# Patient Record
Sex: Female | Born: 1990
Health system: Southern US, Community
[De-identification: ages and names within clinical notes are randomized; demographics above are authoritative.]

## PROBLEM LIST (undated history)

## (undated) ENCOUNTER — Inpatient Hospital Stay (HOSPITAL_COMMUNITY): Payer: Self-pay

## (undated) DIAGNOSIS — R768 Other specified abnormal immunological findings in serum: Principal | ICD-10-CM

## (undated) DIAGNOSIS — I10 Essential (primary) hypertension: Secondary | ICD-10-CM

## (undated) DIAGNOSIS — D591 Other autoimmune hemolytic anemias: Secondary | ICD-10-CM

## (undated) DIAGNOSIS — K589 Irritable bowel syndrome without diarrhea: Secondary | ICD-10-CM

## (undated) DIAGNOSIS — F32A Depression, unspecified: Secondary | ICD-10-CM

## (undated) DIAGNOSIS — F329 Major depressive disorder, single episode, unspecified: Secondary | ICD-10-CM

## (undated) DIAGNOSIS — K219 Gastro-esophageal reflux disease without esophagitis: Secondary | ICD-10-CM

## (undated) DIAGNOSIS — R51 Headache: Secondary | ICD-10-CM

## (undated) DIAGNOSIS — M359 Systemic involvement of connective tissue, unspecified: Secondary | ICD-10-CM

## (undated) DIAGNOSIS — Z8669 Personal history of other diseases of the nervous system and sense organs: Secondary | ICD-10-CM

## (undated) DIAGNOSIS — M329 Systemic lupus erythematosus, unspecified: Secondary | ICD-10-CM

## (undated) DIAGNOSIS — K900 Celiac disease: Secondary | ICD-10-CM

## (undated) DIAGNOSIS — F419 Anxiety disorder, unspecified: Secondary | ICD-10-CM

## (undated) DIAGNOSIS — E039 Hypothyroidism, unspecified: Secondary | ICD-10-CM

## (undated) DIAGNOSIS — D5911 Warm autoimmune hemolytic anemia: Secondary | ICD-10-CM

## (undated) DIAGNOSIS — IMO0002 Reserved for concepts with insufficient information to code with codable children: Secondary | ICD-10-CM

## (undated) DIAGNOSIS — R519 Headache, unspecified: Secondary | ICD-10-CM

## (undated) DIAGNOSIS — E559 Vitamin D deficiency, unspecified: Secondary | ICD-10-CM

## (undated) DIAGNOSIS — O24419 Gestational diabetes mellitus in pregnancy, unspecified control: Secondary | ICD-10-CM

## (undated) HISTORY — DX: Other specified abnormal immunological findings in serum: R76.8

## (undated) HISTORY — DX: Headache, unspecified: R51.9

## (undated) HISTORY — DX: Hypothyroidism, unspecified: E03.9

## (undated) HISTORY — DX: Celiac disease: K90.0

## (undated) HISTORY — DX: Headache: R51

## (undated) HISTORY — DX: Systemic involvement of connective tissue, unspecified: M35.9

## (undated) HISTORY — DX: Major depressive disorder, single episode, unspecified: F32.9

## (undated) HISTORY — DX: Vitamin D deficiency, unspecified: E55.9

## (undated) HISTORY — PX: NO PAST SURGERIES: SHX2092

## (undated) HISTORY — DX: Depression, unspecified: F32.A

## (undated) HISTORY — DX: Gestational diabetes mellitus in pregnancy, unspecified control: O24.419

## (undated) HISTORY — DX: Irritable bowel syndrome, unspecified: K58.9

## (undated) HISTORY — DX: Essential (primary) hypertension: I10

## (undated) HISTORY — DX: Gastro-esophageal reflux disease without esophagitis: K21.9

---
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
## 2010-08-08 ENCOUNTER — Encounter: Payer: Self-pay | Admitting: Family Medicine

## 2010-10-25 LAB — DIFFERENTIAL
Basophils Absolute: 0.1 10*3/uL (ref 0.0–0.1)
Lymphocytes Relative: 26 % (ref 12–46)
Lymphs Abs: 2.9 10*3/uL (ref 0.7–4.0)
Monocytes Absolute: 0.7 10*3/uL (ref 0.1–1.0)
Monocytes Relative: 6 % (ref 3–12)
Neutro Abs: 7.6 10*3/uL (ref 1.7–7.7)

## 2010-10-25 LAB — CBC
HCT: 44 % (ref 36.0–46.0)
MCV: 86.4 fL (ref 78.0–100.0)
Platelets: 391 10*3/uL (ref 150–400)
RDW: 13.6 % (ref 11.5–15.5)

## 2010-10-25 LAB — URINALYSIS, ROUTINE W REFLEX MICROSCOPIC
Bilirubin Urine: NEGATIVE
Hgb urine dipstick: NEGATIVE
Nitrite: NEGATIVE
Protein, ur: NEGATIVE mg/dL
Urobilinogen, UA: 0.2 mg/dL (ref 0.0–1.0)

## 2010-10-25 LAB — COMPREHENSIVE METABOLIC PANEL
Albumin: 4.3 g/dL (ref 3.5–5.2)
BUN: 5 mg/dL — ABNORMAL LOW (ref 6–23)
Creatinine, Ser: 0.84 mg/dL (ref 0.4–1.2)
Total Protein: 7.5 g/dL (ref 6.0–8.3)

## 2010-10-25 LAB — POCT PREGNANCY, URINE: Preg Test, Ur: NEGATIVE

## 2012-12-24 ENCOUNTER — Telehealth: Payer: Self-pay | Admitting: Family Medicine

## 2012-12-24 NOTE — Telephone Encounter (Signed)
PT C/O SHARPE PAIN IN BACK AND SOB, COUGHING. FEELS LIKE SOMETHING STUCK IN THROAT. ADVISED TO GO TO ER NOW. PT STATED HUSBAND WILL TAKE HER NOW.

## 2013-04-30 ENCOUNTER — Encounter (HOSPITAL_COMMUNITY): Payer: Self-pay | Admitting: Emergency Medicine

## 2013-04-30 DIAGNOSIS — Z792 Long term (current) use of antibiotics: Secondary | ICD-10-CM | POA: Insufficient documentation

## 2013-04-30 DIAGNOSIS — F411 Generalized anxiety disorder: Secondary | ICD-10-CM | POA: Insufficient documentation

## 2013-04-30 DIAGNOSIS — K219 Gastro-esophageal reflux disease without esophagitis: Secondary | ICD-10-CM | POA: Insufficient documentation

## 2013-04-30 DIAGNOSIS — N390 Urinary tract infection, site not specified: Secondary | ICD-10-CM | POA: Insufficient documentation

## 2013-04-30 DIAGNOSIS — IMO0001 Reserved for inherently not codable concepts without codable children: Secondary | ICD-10-CM | POA: Insufficient documentation

## 2013-04-30 DIAGNOSIS — Z3202 Encounter for pregnancy test, result negative: Secondary | ICD-10-CM | POA: Insufficient documentation

## 2013-04-30 LAB — COMPREHENSIVE METABOLIC PANEL
Alkaline Phosphatase: 72 U/L (ref 39–117)
BUN: 10 mg/dL (ref 6–23)
GFR calc Af Amer: >90 mL/min (ref >90)
Glucose, Bld: 102 mg/dL — ABNORMAL HIGH (ref 70–99)
Potassium: 4 mEq/L (ref 3.5–5.1)
Total Bilirubin: 0.3 mg/dL (ref 0.3–1.2)
Total Protein: 8.1 g/dL (ref 6.0–8.3)

## 2013-04-30 LAB — CBC WITH DIFFERENTIAL/PLATELET
Eosinophils Absolute: 0.1 10*3/uL (ref 0.0–0.7)
Hemoglobin: 13.9 g/dL (ref 12.0–15.0)
Lymphs Abs: 2.4 10*3/uL (ref 0.7–4.0)
MCH: 29.3 pg (ref 26.0–34.0)
MCV: 86.3 fL (ref 78.0–100.0)
Monocytes Relative: 7 % (ref 3–12)
Neutrophils Relative %: 67 % (ref 43–77)
RBC: 4.75 MIL/uL (ref 3.87–5.11)

## 2013-04-30 LAB — URINALYSIS, ROUTINE W REFLEX MICROSCOPIC
Ketones, ur: NEGATIVE mg/dL
Nitrite: NEGATIVE
Protein, ur: NEGATIVE mg/dL
Urobilinogen, UA: 0.2 mg/dL (ref 0.0–1.0)
pH: 7 (ref 5.0–8.0)

## 2013-04-30 NOTE — ED Notes (Signed)
The pt has  Had posterior chest pain  For one week earache neck pain headache dizziness  She has a sorethroat vomited.  Aching all over her body.  lmp sept 19

## 2013-05-01 ENCOUNTER — Emergency Department (HOSPITAL_COMMUNITY)
Admission: EM | Admit: 2013-05-01 | Discharge: 2013-05-01 | Disposition: A | Discharge status: Home / Self Care | Payer: BC Managed Care – PPO | Attending: Emergency Medicine | Admitting: Emergency Medicine

## 2013-05-01 DIAGNOSIS — N390 Urinary tract infection, site not specified: Secondary | ICD-10-CM

## 2013-05-01 DIAGNOSIS — F419 Anxiety disorder, unspecified: Secondary | ICD-10-CM

## 2013-05-01 MED ORDER — IBUPROFEN 600 MG PO TABS: 600.0000 mg | ORAL_TABLET | Freq: Four times a day (QID) | ORAL | Status: DC | PRN

## 2013-05-01 MED ORDER — NITROFURANTOIN MONOHYD MACRO 100 MG PO CAPS
100.0000 mg | ORAL_CAPSULE | Freq: Once | ORAL | Status: AC
  Administered 2013-05-01: 100 mg via ORAL
  Filled 2013-05-01: qty 1

## 2013-05-01 MED ORDER — IBUPROFEN 400 MG PO TABS
600.0000 mg | ORAL_TABLET | Freq: Once | ORAL | Status: AC
  Administered 2013-05-01: 600 mg via ORAL
  Filled 2013-05-01 (×2): qty 1

## 2013-05-01 MED ORDER — CETIRIZINE HCL 10 MG PO TABS: 10.0000 mg | ORAL_TABLET | Freq: Every day | ORAL | Status: DC

## 2013-05-01 MED ORDER — NITROFURANTOIN MONOHYD MACRO 100 MG PO CAPS: 100.0000 mg | ORAL_CAPSULE | Freq: Two times a day (BID) | ORAL | Status: DC

## 2013-05-01 MED ORDER — GI COCKTAIL ~~LOC~~
30.0000 mL | Freq: Once | ORAL | Status: AC
  Administered 2013-05-01: 30 mL via ORAL
  Filled 2013-05-01: qty 30

## 2013-05-01 NOTE — ED Notes (Signed)
Pt. C/o intermittent pain starting in back and radiating to chest. States "It feels like the left side of my lungs are full". C/o SOB, fever, N/V, and generalized body aches. Pt. Alert and oriented x4.

## 2013-05-01 NOTE — ED Provider Notes (Signed)
CSN: [id number]     Arrival date & time 04/30/13  2011 History   First MD Initiated Contact with Patient 05/01/13 0032     Chief Complaint  Patient presents with  . multiple complaints    (Consider location/radiation/quality/duration/timing/severity/associated sxs/prior Treatment) Patient is a 22 y.o. female presenting with ear pain. The history is provided by the patient. No language interpreter was used.  Otalgia Location:  Left Behind ear:  No abnormality Severity:  Moderate Onset quality:  Gradual Timing:  Constant Progression:  Unchanged Context: not direct blow and not elevation change   Relieved by:  Nothing Worsened by:  Nothing tried Ineffective treatments:  None tried Associated symptoms: no abdominal pain, no cough, no diarrhea, no neck pain, no rash, no rhinorrhea, no tinnitus and no vomiting    Also has myalgias and back pain and radiates around.  No neck stiffness no rashes on the skin.  Has GERD and allergies and is off all her meds.  No swelling in the legs.  No OCP no long car trips or plane trips.  No rashes.  Is feeling anxious and thinks she is having a lot of panic attacks.  Wants ears checked History reviewed. No pertinent past medical history. History reviewed. No pertinent past surgical history. No family history on file. History  Substance Use Topics  . Smoking status: Never Smoker   . Smokeless tobacco: Not on file  . Alcohol Use: No   OB History   Grav Para Term Preterm Abortions TAB SAB Ect Mult Living                 Review of Systems  HENT: Positive for ear pain. Negative for facial swelling, rhinorrhea, tinnitus and voice change.   Respiratory: Negative for cough, chest tightness, shortness of breath and wheezing.   Gastrointestinal: Negative for vomiting, abdominal pain and diarrhea.  Musculoskeletal: Negative for neck pain and neck stiffness.  Skin: Negative for rash.  Neurological: Negative for dizziness, facial asymmetry, weakness and  numbness.  All other systems reviewed and are negative.    Allergies  Topamax  Home Medications   Current Outpatient Rx  Name  Route  Sig  Dispense  Refill  . cephALEXin (KEFLEX) 500 MG capsule   Oral   Take 500 mg by mouth 4 (four) times daily.          BP 138/67  Pulse 92  Temp(Src) 98.8 F (37.1 C)  Resp 20  Wt 195 lb 14.4 oz (88.86 kg)  SpO2 100%  LMP 04/04/2013 Physical Exam  Constitutional: She is oriented to person, place, and time. She appears well-developed and well-nourished.  HENT:  Head: Normocephalic and atraumatic.  Right Ear: No mastoid tenderness. Tympanic membrane is not injected, not perforated, not erythematous and not retracted. No hemotympanum.  Left Ear: No mastoid tenderness. Tympanic membrane is not perforated, not erythematous and not retracted. No hemotympanum.  Mouth/Throat: Oropharynx is clear and moist. No oropharyngeal exudate.  Eyes: Conjunctivae and EOM are normal. Pupils are equal, round, and reactive to light.  Neck: Normal range of motion. Neck supple.  Cardiovascular: Normal rate and regular rhythm.   Pulmonary/Chest: Effort normal and breath sounds normal. No stridor. She has no wheezes. She has no rales. She exhibits no tenderness.  Abdominal: Soft. Bowel sounds are normal. There is no tenderness. There is no rebound and no guarding.  Musculoskeletal: Normal range of motion. She exhibits no edema.  Lymphadenopathy:    She has no cervical adenopathy.  Neurological:  She is alert and oriented to person, place, and time. She has normal reflexes.  Skin: Skin is warm and dry.  Psychiatric: Her mood appears anxious. Her speech is rapid and/or pressured.    ED Course  Procedures (including critical care time) Labs Review Labs Reviewed  COMPREHENSIVE METABOLIC PANEL - Abnormal; Notable for the following:    Glucose, Bld 102 (*)    All other components within normal limits  URINALYSIS, ROUTINE W REFLEX MICROSCOPIC - Abnormal; Notable  for the following:    APPearance HAZY (*)    Hgb urine dipstick SMALL (*)    Leukocytes, UA MODERATE (*)    All other components within normal limits  URINE MICROSCOPIC-ADD ON - Abnormal; Notable for the following:    Squamous Epithelial / LPF MANY (*)    All other components within normal limits  CBC WITH DIFFERENTIAL  PREGNANCY, URINE   Imaging Review No results found.  EKG Interpretation     Ventricular Rate:  100 PR Interval:  128 QRS Duration: 86 QT Interval:  348 QTC Calculation: 448 R Axis:   75 Text Interpretation:  Normal sinus rhythm            MDM  No diagnosis found.  Date: 05/01/2013  Rate: 100  Rhythm: normal sinus rhythm  QRS Axis: normal  Intervals: normal  ST/T Wave abnormalities: normal  Conduction Disutrbances: none  Narrative Interpretation: unremarkable   Perc negative and wells 0, not cardiac in nature.     Anxiety given exam and number of complaints will also treat for allergies and UTI.   Follow up with your PMD  Delaina Fetsch K Jair Lindblad-Rasch, MD 05/01/13 [phone]

## 2013-05-26 ENCOUNTER — Other Ambulatory Visit: Payer: Self-pay | Admitting: Adult Health

## 2013-05-29 ENCOUNTER — Emergency Department (HOSPITAL_COMMUNITY)
Admission: EM | Admit: 2013-05-29 | Discharge: 2013-05-29 | Disposition: A | Discharge status: Home / Self Care | Payer: BC Managed Care – PPO | Attending: Emergency Medicine | Admitting: Emergency Medicine

## 2013-05-29 ENCOUNTER — Encounter (HOSPITAL_COMMUNITY): Payer: Self-pay | Admitting: Emergency Medicine

## 2013-05-29 ENCOUNTER — Emergency Department (HOSPITAL_COMMUNITY): Payer: BC Managed Care – PPO

## 2013-05-29 DIAGNOSIS — Z8669 Personal history of other diseases of the nervous system and sense organs: Secondary | ICD-10-CM | POA: Insufficient documentation

## 2013-05-29 DIAGNOSIS — R1110 Vomiting, unspecified: Secondary | ICD-10-CM

## 2013-05-29 DIAGNOSIS — Z888 Allergy status to other drugs, medicaments and biological substances status: Secondary | ICD-10-CM | POA: Insufficient documentation

## 2013-05-29 DIAGNOSIS — H538 Other visual disturbances: Secondary | ICD-10-CM | POA: Insufficient documentation

## 2013-05-29 DIAGNOSIS — R630 Anorexia: Secondary | ICD-10-CM | POA: Insufficient documentation

## 2013-05-29 DIAGNOSIS — F411 Generalized anxiety disorder: Secondary | ICD-10-CM | POA: Insufficient documentation

## 2013-05-29 DIAGNOSIS — Z79899 Other long term (current) drug therapy: Secondary | ICD-10-CM | POA: Insufficient documentation

## 2013-05-29 DIAGNOSIS — R209 Unspecified disturbances of skin sensation: Secondary | ICD-10-CM | POA: Insufficient documentation

## 2013-05-29 DIAGNOSIS — R112 Nausea with vomiting, unspecified: Secondary | ICD-10-CM | POA: Insufficient documentation

## 2013-05-29 DIAGNOSIS — R51 Headache: Secondary | ICD-10-CM | POA: Insufficient documentation

## 2013-05-29 HISTORY — DX: Anxiety disorder, unspecified: F41.9

## 2013-05-29 MED ORDER — ONDANSETRON HCL 4 MG PO TABS: 4.0000 mg | ORAL_TABLET | Freq: Four times a day (QID) | ORAL | Status: DC

## 2013-05-29 MED ORDER — METOCLOPRAMIDE HCL 5 MG/ML IJ SOLN
10.0000 mg | Freq: Once | INTRAMUSCULAR | Status: AC
  Administered 2013-05-29: 10 mg via INTRAVENOUS
  Filled 2013-05-29: qty 2

## 2013-05-29 MED ORDER — SODIUM CHLORIDE 0.9 % IV BOLUS (SEPSIS)
1000.0000 mL | Freq: Once | INTRAVENOUS | Status: AC
  Administered 2013-05-29: 1000 mL via INTRAVENOUS

## 2013-05-29 MED ORDER — DIPHENHYDRAMINE HCL 50 MG/ML IJ SOLN
50.0000 mg | Freq: Once | INTRAMUSCULAR | Status: AC
  Administered 2013-05-29: 50 mg via INTRAVENOUS
  Filled 2013-05-29: qty 1

## 2013-05-29 MED ORDER — KETOROLAC TROMETHAMINE 30 MG/ML IJ SOLN
30.0000 mg | Freq: Once | INTRAMUSCULAR | Status: AC
  Administered 2013-05-29: 30 mg via INTRAVENOUS
  Filled 2013-05-29: qty 1

## 2013-05-29 NOTE — ED Provider Notes (Signed)
CSN: [id number]     Arrival date & time 05/29/13  4696 History   First MD Initiated Contact with Patient 05/29/13 [phone]     Chief Complaint  Patient presents with  . Headache  . Numbness   (Consider location/radiation/quality/duration/timing/severity/associated sxs/prior Treatment) HPI Comments: 22 yo female with anxiety hx presents with headache and pins/ needles in feet.  HA intermittent for one month, more severe than normal migraines, posterior pressure with radiation to frontal area, gradual onset. No fevers.  Pt has intermittent mouth tingling and bilateral feet tingling, she feels she is anxious at the time. Pt has felt sinus pressure recently as well. No blood clot hx, recent surgery or OCPs.   Patient is a 22 y.o. female presenting with headaches. The history is provided by the patient.  Headache Associated symptoms: nausea and vomiting   Associated symptoms: no abdominal pain, no back pain, no fever, no neck pain and no neck stiffness     Past Medical History  Diagnosis Date  . Anxiety    History reviewed. No pertinent past surgical history. History reviewed. No pertinent family history. History  Substance Use Topics  . Smoking status: Never Smoker   . Smokeless tobacco: Not on file  . Alcohol Use: No   OB History   Grav Para Term Preterm Abortions TAB SAB Ect Mult Living                 Review of Systems  Constitutional: Positive for appetite change. Negative for fever and chills.  Eyes: Positive for visual disturbance (mild blurry bilateral).  Respiratory: Negative for shortness of breath.   Cardiovascular: Negative for chest pain.  Gastrointestinal: Positive for nausea and vomiting. Negative for abdominal pain.  Genitourinary: Negative for dysuria and flank pain.  Musculoskeletal: Negative for back pain, neck pain and neck stiffness.  Skin: Negative for rash.  Neurological: Positive for headaches. Negative for light-headedness.    Allergies   Topamax  Home Medications   Current Outpatient Rx  Name  Route  Sig  Dispense  Refill  . acetaminophen (TYLENOL) 325 MG tablet   Oral   Take 650 mg by mouth every 6 (six) hours as needed for headache.         . cetirizine (WAL-ZYR) 10 MG tablet   Oral   Take 1 tablet (10 mg total) by mouth daily.   30 tablet   0   . citalopram (CELEXA) 20 MG tablet   Oral   Take 20 mg by mouth daily.         Marland Kitchen ibuprofen (ADVIL,MOTRIN) 600 MG tablet   Oral   Take 1 tablet (600 mg total) by mouth every 6 (six) hours as needed for pain.   30 tablet   0   . naproxen sodium (ANAPROX) 220 MG tablet   Oral   Take 440 mg by mouth as needed (headache).         . ondansetron (ZOFRAN) 4 MG tablet   Oral   Take 1 tablet (4 mg total) by mouth every 6 (six) hours.   12 tablet   0    BP 157/71  Pulse 66  Temp(Src) 98 F (36.7 C) (Oral)  Resp 18  Ht 5\' 4"  (1.626 m)  Wt 195 lb (88.451 kg)  BMI 33.46 kg/m2  SpO2 99%  LMP 05/08/2013 Physical Exam  Nursing note and vitals reviewed. Constitutional: She is oriented to person, place, and time. She appears well-developed and well-nourished.  HENT:  Head: Normocephalic  and atraumatic.  Eyes: Conjunctivae are normal. Right eye exhibits no discharge. Left eye exhibits no discharge.  Neck: Normal range of motion. Neck supple. No tracheal deviation present.  Cardiovascular: Normal rate and regular rhythm.   Pulmonary/Chest: Effort normal and breath sounds normal.  Abdominal: Soft. She exhibits no distension. There is no tenderness. There is no guarding.  Musculoskeletal: She exhibits no edema.  Neurological: She is alert and oriented to person, place, and time. GCS eye subscore is 4. GCS verbal subscore is 5. GCS motor subscore is 6.  5+ strength in UE and LE with f/e at major joints. Sensation to palpation intact in UE and LE. CNs 2-12 grossly intact.  EOMFI.  PERRL.   Finger nose and coordination intact bilateral.   Visual fields intact to  finger testing.   Skin: Skin is warm. No rash noted.  Psychiatric: She has a normal mood and affect.    ED Course  Procedures (including critical care time) Labs Review Labs Reviewed - No data to display Imaging Review Ct Head Wo Contrast  05/29/2013   CLINICAL DATA:  Chronic headache, not relieved with over the counter medication.  EXAM: CT HEAD WITHOUT CONTRAST  TECHNIQUE: Contiguous axial images were obtained from the base of the skull through the vertex without intravenous contrast.  COMPARISON:  None available for comparison at time of study interpretation.  FINDINGS: The ventricles and sulci are normal. No intraparenchymal hemorrhage, mass effect nor midline shift. No acute large vascular territory infarcts.  No abnormal extra-axial fluid collections. Basal cisterns are patent.  No skull fracture. Visualized paranasal sinuses and mastoid air-cells are well-aerated. The included ocular globes and orbital contents are non-suspicious.  IMPRESSION: No acute intracranial process.  Normal noncontrast CT of the brain.   Electronically Signed   By: Awilda Metro   On: 05/29/2013 06:08    EKG Interpretation   None       MDM   1. Headache   2. Vomiting    Sinus HA vs migraine vs sinusitis vs intracranial htn vs other. No signs of meningitis. Normal neuro exam, no obvious papilledema.   HA improved in ED. CT no acute findings. Discussed close fup with neuro and pcp. Pt comfortable with plan.  DC    Enid Skeens, MD 05/29/13 [phone]

## 2013-05-29 NOTE — ED Notes (Signed)
Pt states understanding of discharge instructions 

## 2013-05-29 NOTE — ED Notes (Signed)
Pt complains of headache and pins and needles in hands and feet, headache onset 1 month ago. No relief with otc meds

## 2013-06-01 ENCOUNTER — Emergency Department (HOSPITAL_COMMUNITY)
Admission: EM | Admit: 2013-06-01 | Discharge: 2013-06-01 | Disposition: A | Discharge status: Home / Self Care | Payer: BC Managed Care – PPO | Attending: Emergency Medicine | Admitting: Emergency Medicine

## 2013-06-01 ENCOUNTER — Encounter (HOSPITAL_COMMUNITY): Payer: Self-pay | Admitting: Emergency Medicine

## 2013-06-01 DIAGNOSIS — Z3202 Encounter for pregnancy test, result negative: Secondary | ICD-10-CM | POA: Insufficient documentation

## 2013-06-01 DIAGNOSIS — R630 Anorexia: Secondary | ICD-10-CM | POA: Insufficient documentation

## 2013-06-01 DIAGNOSIS — Z79899 Other long term (current) drug therapy: Secondary | ICD-10-CM | POA: Insufficient documentation

## 2013-06-01 DIAGNOSIS — E860 Dehydration: Secondary | ICD-10-CM | POA: Insufficient documentation

## 2013-06-01 DIAGNOSIS — H53149 Visual discomfort, unspecified: Secondary | ICD-10-CM | POA: Insufficient documentation

## 2013-06-01 DIAGNOSIS — Z888 Allergy status to other drugs, medicaments and biological substances status: Secondary | ICD-10-CM | POA: Insufficient documentation

## 2013-06-01 DIAGNOSIS — H538 Other visual disturbances: Secondary | ICD-10-CM | POA: Insufficient documentation

## 2013-06-01 DIAGNOSIS — F411 Generalized anxiety disorder: Secondary | ICD-10-CM | POA: Insufficient documentation

## 2013-06-01 DIAGNOSIS — R51 Headache: Secondary | ICD-10-CM | POA: Insufficient documentation

## 2013-06-01 LAB — POCT I-STAT, CHEM 8
BUN: 14 mg/dL (ref 6–23)
Calcium, Ion: 1.19 mmol/L (ref 1.12–1.23)
Chloride: 104 mEq/L (ref 96–112)
Glucose, Bld: 88 mg/dL (ref 70–99)
HCT: 45 % (ref 36.0–46.0)
Potassium: 4.3 mEq/L (ref 3.5–5.1)
Sodium: 139 mEq/L (ref 135–145)

## 2013-06-01 LAB — URINALYSIS, ROUTINE W REFLEX MICROSCOPIC
Glucose, UA: NEGATIVE mg/dL
Hgb urine dipstick: NEGATIVE
Ketones, ur: 40 mg/dL — AB
Nitrite: NEGATIVE
Specific Gravity, Urine: 1.029 (ref 1.005–1.030)
pH: 5 (ref 5.0–8.0)

## 2013-06-01 MED ORDER — METOCLOPRAMIDE HCL 5 MG/ML IJ SOLN
10.0000 mg | Freq: Once | INTRAMUSCULAR | Status: DC
  Filled 2013-06-01: qty 2

## 2013-06-01 MED ORDER — SODIUM CHLORIDE 0.9 % IV BOLUS (SEPSIS)
1000.0000 mL | Freq: Once | INTRAVENOUS | Status: DC
Start: 2013-06-01 — End: 2013-06-01

## 2013-06-01 MED ORDER — KETOROLAC TROMETHAMINE 30 MG/ML IJ SOLN
30.0000 mg | Freq: Once | INTRAMUSCULAR | Status: DC
  Filled 2013-06-01: qty 1

## 2013-06-01 MED ORDER — DIPHENHYDRAMINE HCL 50 MG/ML IJ SOLN
25.0000 mg | Freq: Once | INTRAMUSCULAR | Status: DC
  Filled 2013-06-01: qty 1

## 2013-06-01 NOTE — ED Notes (Signed)
Refused IV start; does not want medication or fluids. Notified [place], PA.

## 2013-06-01 NOTE — ED Notes (Signed)
Pt reports that she was just here for a headache that has been persistent for the past month. Reports that she has had nausea and vomiting. Pt reports she has been having hot flashes, reports that she has also had a blurred vision.

## 2013-06-01 NOTE — Discharge Instructions (Signed)
Please read and follow all provided instructions.  Your diagnoses today include:  1. Headache     Tests performed today include:  Blood counts and electrolytes - shows mild dehydration  Urine test - shows mild dehydration  Vital signs. See below for your results today.   Medications:   Use home medications  Take any prescribed medications only as directed.  Additional information:  Follow any educational materials contained in this packet.  You are having a headache. No specific cause was found today for your headache. It may have been a migraine or other cause of headache. Stress, anxiety, fatigue, and depression are common triggers for headaches. \  Your headache today does not appear to be life-threatening or require hospitalization, but often the exact cause of headaches is not determined in the emergency department. Therefore, follow-up with your doctor is very important to find out what may have caused your headache and whether or not you need any further diagnostic testing or treatment.   Sometimes headaches can appear benign (not harmful), but then more serious symptoms can develop which should prompt an immediate re-evaluation by your doctor or the emergency department.  BE VERY CAREFUL not to take multiple medicines containing Tylenol (also called acetaminophen). Doing so can lead to an overdose which can damage your liver and cause liver failure and possibly death.   Follow-up instructions: Please follow-up with your primary care provider in the next 3 days for further evaluation of your symptoms. If you do not have a primary care doctor -- see below for referral information.   Return instructions:   Please return to the Emergency Department if you experience worsening symptoms.  Return if the medications do not resolve your headache, if it recurs, or if you have multiple episodes of vomiting or cannot keep down fluids.  Return if you have a change from the usual  headache.  RETURN IMMEDIATELY IF you:  Develop a sudden, severe headache  Develop confusion or become poorly responsive or faint  Develop a fever above 100.11F or problem breathing  Have a change in speech, vision, swallowing, or understanding  Develop new weakness, numbness, tingling, incoordination in your arms or legs  Have a seizure  Please return if you have any other emergent concerns.  Additional Information:  Your vital signs today were: BP 129/67   Pulse 75   Temp(Src) 98.6 F (37 C) (Oral)   Resp 16   Wt 194 lb (87.998 kg)   SpO2 100%   LMP 05/08/2013 If your blood pressure (BP) was elevated above 135/85 this visit, please have this repeated by your doctor within one month. -------------- RESOURCE GUIDE  Chronic Pain Problems:  [facility]:  [phone]  Patients need to be referred by their primary care doctor  Insufficient Money for Medicine:  [employer]:     call "211"  [facility]:  [phone]  No Primary Care Doctor:  To locate a primary care doctor that accepts your insurance or provides certain services:  [facility] :   [phone]  Physician Referral Service:  [phone]  Agencies that provide inexpensive medical care:  [name]:   [phone]  [name]:   [phone]  [facility]:   [phone]  Women's Clinic:     [phone]  Planned Parenthood:    [phone]  [facility]:     [phone]  Medicaid-accepting [facility] Providers:  [facility]  [address], [phone], Mon-Fri 9am-7pm, Sat 9am-1pm  [facility]  [address] [place], [place], [phone]  [facility]  [address], [address] [place], [phone]  [facility]  [address] [phone]  Renaye Rakers  [address], [phone]  Only accepts [employer]  patients after they have their name  applied to their card  Self Pay (no insurance) in [facility]:  Sickle Cell Patients: Dr. Willey Blade, [facility]  [address] [place], [phone]  [facility]  [address] [place], [phone]  [name] [place]  [address] [facility] [address], [address], [facility] - [address]  [phone], Mon-Fri 9am-7pm, Sat 9am-1pm  [facility]  [address] [place], [phone]  [facility]  [address], [phone]  [facility]  [address] [phone]  Dr. Julio Sicks  [address], [place], [phone]  [facility]  [address] [phone]  [facility]  [address], [phone]  [address], [phone]  [facility]  [address] [place], [phone], 1st & 3rd Saturday every month, 10am-1pm  Strategies for finding a Primary Care Provider:  1) Find a Doctor and Pay Out of Pocket Although you won't have to find out who is covered by your insurance plan, it is a good idea to ask around and get recommendations. You will then need to call the office and see if the doctor you have chosen will accept you as a new patient and what types of options they offer for patients who are self-pay. Some doctors offer discounts or will set up payment plans for their patients who do not have insurance, but you will need to ask so you aren't surprised when you get to your appointment.  2) Contact Your Local Health Department Not all health departments have doctors that can see patients for sick visits, but many do, so it is worth a call to see if yours does. If you don't know where your local health department is, you can check in your phone book. The CDC also has a tool to help you locate your state's health department, and many state websites also have listings of all of their local  health departments.  3) Find a Walk-in Clinic If your illness is not likely to be very severe or complicated, you may want to try a walk in clinic. These are popping up all over the country in pharmacies, drugstores, and shopping centers. They're usually staffed by nurse practitioners or physician assistants that have been trained to treat common illnesses and complaints. They're usually fairly quick and inexpensive. However, if you have serious medical issues or chronic medical problems, these are probably not your best option  STD Testing:  [facility], [place] [facility]  [address], [place], phone [phone] or [phone]    Monday - Friday, call for an appointment  [facility] of [facility], [place] [facility]  [address], [place], phone [phone] or [phone]   Monday - Friday, call for an appointment  Abuse/Neglect:  [facility] Child Abuse Hotline:  365-853-2458  [facility] Child Abuse Hotline: 440-207-5750 (After Hours)  Emergency Shelter:  [facility] [phone]  Maternity Homes:  Room  at the [facility] [place] [facility]: [phone]  [facility] Alert Services: 651-812-1002  MRSA Hotline:   867-208-1416   [facility]  [address] [place]  [phone]   [place]  [address], [place]  [phone]   [facility] Dept.  [address], [place]  [phone]   [facility]  [phone]   [facility]  [phone]   [facility] in [place]  [address]  [phone], [facility] Child Abuse Hotline  857-783-3557  [phone] (After Hours)   Behavioral Health Services  Substance Abuse Resources:  Alcohol and Drug Services:     [phone]  Addiction  [facility]:   [phone]  [facility]:     [phone]  [facility]:      [phone]  Residential & Outpatient Substance Abuse Program: 3010286687  Psychological Services:  [facility]:   [phone]  [facility] Services:    [phone]  [facility] Mental Health  [address], [place]  ACCESS LINE: [phone] or [phone]  [employer]  Dental Assistance: If unable to pay or uninsured, contact:  [facility] or [facility]. to become qualified for the adult dental clinic.  Patients with Medicaid  [facility]  [phone] [address], [phone]  [address], [phone]  If unable to pay, or uninsured: contact [facility] [phone]) or [facility] Department [phone] in [place], [phone] in [facility]) to become qualified for the adult dental clinic  Other Low-Cost Community Dental Services:  [facility]  [address] [place], [place], [place], [address]  [phone], Ext. [phone]  2nd and 4th Thursday of the month at [facility]  [facility]  [address] [place], [place], [place], [address]  [phone]  [facility]  [address] [place], [place], [address]  [phone]  [facility] Health Department  [phone]  [facility] Health Department  [phone]  [facility] Department  [phone]

## 2013-06-01 NOTE — ED Provider Notes (Signed)
CSN: [id number]     Arrival date & time 06/01/13  1750 History   First MD Initiated Contact with Patient 06/01/13 1807     Chief Complaint  Patient presents with  . Headache   (Consider location/radiation/quality/duration/timing/severity/associated sxs/prior Treatment) HPI Comments: Patient presents with complaint of headache for the past one month. Headache is constantly there and has not gone away. It is generalized. Patient was seen for this in emergency department 3 days ago had a negative CT scan and was told to followup with urology and take Zofran. Patient states that she was feeling better when she emergency department and the Zofran is not making her feel any better. Patient has not had vomiting but states that she has been unable to eat or drink anything because she "doesn't feel like it". She reports mild photophobia. Patient reports that any loud noises "makes her jump". She denies fever or trouble moving her head or neck. However, she has noted she's been having hot flashes. She denies weakness in her arms or her legs. No vision loss but she has had blurry vision at times. She is scared that she has a serious medical condition. She denies head injury. She denies vague chest and abdominal pains that are intermittent.   The history is provided by the patient and medical records.    Past Medical History  Diagnosis Date  . Anxiety    History reviewed. No pertinent past surgical history. History reviewed. No pertinent family history. History  Substance Use Topics  . Smoking status: Never Smoker   . Smokeless tobacco: Not on file  . Alcohol Use: No   OB History   Grav Para Term Preterm Abortions TAB SAB Ect Mult Living                 Review of Systems  Constitutional: Positive for appetite change. Negative for fever and chills.  HENT: Negative for congestion, dental problem, rhinorrhea and sinus pressure.   Eyes: Positive for photophobia. Negative for discharge, redness and  visual disturbance.  Respiratory: Negative for shortness of breath.   Cardiovascular: Negative for chest pain.  Gastrointestinal: Negative for nausea and vomiting.  Genitourinary: Negative for frequency and flank pain.  Musculoskeletal: Negative for gait problem, neck pain and neck stiffness.  Skin: Negative for rash.  Neurological: Positive for headaches. Negative for syncope, speech difficulty, weakness, light-headedness and numbness.  Psychiatric/Behavioral: Negative for confusion.    Allergies  Celexa and Topamax  Home Medications   Current Outpatient Rx  Name  Route  Sig  Dispense  Refill  . acetaminophen (TYLENOL) 325 MG tablet   Oral   Take 650 mg by mouth every 6 (six) hours as needed for headache.         . albuterol (PROVENTIL) (2.5 MG/3ML) 0.083% nebulizer solution   Nebulization   Take 2.5 mg by nebulization every 6 (six) hours as needed for wheezing or shortness of breath.         . ferrous sulfate 325 (65 FE) MG tablet   Oral   Take 325 mg by mouth daily with breakfast.         . HYDROcodone-acetaminophen (NORCO/VICODIN) 5-325 MG per tablet   Oral   Take 0.5 tablets by mouth every 6 (six) hours as needed for moderate pain.         Marland Kitchen ondansetron (ZOFRAN) 4 MG tablet   Oral   Take 4 mg by mouth every 8 (eight) hours as needed for nausea or vomiting.  BP 142/92  Pulse 92  Temp(Src) 98.6 F (37 C) (Oral)  Resp 16  Wt 194 lb (87.998 kg)  SpO2 100%  LMP 05/08/2013 Physical Exam  Nursing note and vitals reviewed. Constitutional: She is oriented to person, place, and time. She appears well-developed and well-nourished.  HENT:  Head: Normocephalic and atraumatic.  Right Ear: Tympanic membrane, external ear and ear canal normal.  Left Ear: Tympanic membrane, external ear and ear canal normal.  Nose: Nose normal.  Mouth/Throat: Uvula is midline, oropharynx is clear and moist and mucous membranes are normal.  Eyes: Conjunctivae, EOM and lids  are normal. Pupils are equal, round, and reactive to light. Right eye exhibits no nystagmus. Left eye exhibits no nystagmus.  Neck: Normal range of motion. Neck supple.  Cardiovascular: Normal rate and regular rhythm.   Pulmonary/Chest: Effort normal and breath sounds normal.  Abdominal: Soft. There is no tenderness.  Musculoskeletal:       Cervical back: She exhibits normal range of motion, no tenderness and no bony tenderness.  Neurological: She is alert and oriented to person, place, and time. She has normal strength and normal reflexes. No cranial nerve deficit or sensory deficit. She displays a negative Romberg sign. Coordination and gait normal. GCS eye subscore is 4. GCS verbal subscore is 5. GCS motor subscore is 6.  Skin: Skin is warm and dry.  Psychiatric: Her mood appears anxious.    ED Course  Procedures (including critical care time) Labs Review Labs Reviewed  URINALYSIS, ROUTINE W REFLEX MICROSCOPIC - Abnormal; Notable for the following:    Bilirubin Urine SMALL (*)    Ketones, ur 40 (*)    All other components within normal limits  POCT I-STAT, CHEM 8 - Abnormal; Notable for the following:    Hemoglobin 15.3 (*)    All other components within normal limits  POCT PREGNANCY, URINE   Imaging Review No results found.  EKG Interpretation   None      6:44 PM Patient seen and examined. Work-up initiated. Medications ordered.   Vital signs reviewed and are as follows: Filed Vitals:   06/01/13 1818  BP: 142/92  Pulse: 92  Temp:   Resp: 16   8:33 PM After I left the room, patient refuses all treatments. She is willing to wait for lab results. Labs demonstrate only mild dehydration.  Patient informed. She is encouraged to follow up with her primary care physician for further evaluation.  Patient counseled to return if they have weakness in their arms or legs, slurred speech, trouble walking or talking, confusion, trouble with their balance, or if they have any other  concerns. Patient verbalizes understanding and agrees with plan.     MDM   1. Headache    Patient with headache x1 month. Previous head CT was negative. Patient with no new neurological features tonight. She has a normal neurological exam. Labs are unremarkable. Patient does not appear to be significantly dehydrated. She will benefit from PCP and/or neurology followup for evaluation of these headaches. Do not suspect intracranial bleeding, meningitis given recent workup and exam tonight. Doubt pseudotumor cerebri given clinical presentation, no difficulty with balance or vision. Patient appears well but anxious. She's given appropriate return instructions and she is encouraged to followup as soon as possible.    Renne Crigler, PA-C 06/01/13 2037

## 2013-06-02 NOTE — ED Provider Notes (Signed)
Medical screening examination/treatment/procedure(s) were performed by non-physician practitioner and as supervising physician I was immediately available for consultation/collaboration.  EKG Interpretation   None         Gwyneth Sprout, MD 06/02/13 1354

## 2013-07-08 ENCOUNTER — Encounter: Payer: Self-pay | Admitting: Family Medicine

## 2013-07-08 ENCOUNTER — Ambulatory Visit (INDEPENDENT_AMBULATORY_CARE_PROVIDER_SITE_OTHER): Payer: BC Managed Care – PPO | Admitting: Family Medicine

## 2013-07-08 VITALS — BP 132/86 | HR 95 | Temp 98.4°F | Ht 64.0 in | Wt 200.6 lb

## 2013-07-08 DIAGNOSIS — D649 Anemia, unspecified: Secondary | ICD-10-CM

## 2013-07-08 DIAGNOSIS — Z13220 Encounter for screening for lipoid disorders: Secondary | ICD-10-CM

## 2013-07-08 DIAGNOSIS — M25559 Pain in unspecified hip: Secondary | ICD-10-CM

## 2013-07-08 DIAGNOSIS — Z23 Encounter for immunization: Secondary | ICD-10-CM

## 2013-07-08 DIAGNOSIS — J309 Allergic rhinitis, unspecified: Secondary | ICD-10-CM

## 2013-07-08 MED ORDER — FLUTICASONE PROPIONATE 50 MCG/ACT NA SUSP: 2.0000 | Freq: Every day | NASAL | Status: DC

## 2013-07-08 MED ORDER — MIRTAZAPINE 7.5 MG PO TABS: 7.5000 mg | ORAL_TABLET | Freq: Every day | ORAL | Status: DC
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
## 2013-07-08 NOTE — Patient Instructions (Signed)
      Dr Woodroe Mode Recommendations  For nutrition information, I recommend books:  1).Eat to Live by Dr Monico Hoar. 2).Prevent and Reverse Heart Disease by Dr Suzzette Righter. 3) Dr Katherina Right Book:  Program to Reverse Diabetes  Exercise recommendations are:  If unable to walk, then the patient can exercise in a chair 3 times a day. By flapping arms like a bird gently and raising legs outwards to the front.  If ambulatory, the patient can go for walks for 30 minutes 3 times a week. Then increase the intensity and duration as tolerated.  Goal is to try to attain exercise frequency to 5 times a week.  If applicable: Best to perform resistance exercises (machines or weights) 2 days a week and cardio type exercises 3 days per week.    Book: What Happy People Know.

## 2013-07-09 LAB — CMP14+EGFR
ALT: 18 IU/L (ref 0–32)
AST: 14 IU/L (ref 0–40)
Albumin/Globulin Ratio: 1.9 (ref 1.1–2.5)
Albumin: 4.8 g/dL (ref 3.5–5.5)
Alkaline Phosphatase: 67 IU/L (ref 39–117)
BUN/Creatinine Ratio: 14 (ref 8–20)
BUN: 11 mg/dL (ref 6–20)
CO2: 21 mmol/L (ref 18–29)
Calcium: 9.4 mg/dL (ref 8.7–10.2)
Chloride: 99 mmol/L (ref 97–108)
Creatinine, Ser: 0.77 mg/dL (ref 0.57–1.00)
GFR calc Af Amer: 126 mL/min/{1.73_m2} (ref >59)
GFR calc non Af Amer: 109 mL/min/{1.73_m2} (ref >59)
Globulin, Total: 2.5 g/dL (ref 1.5–4.5)
Glucose: 85 mg/dL (ref 65–99)
Potassium: 4.7 mmol/L (ref 3.5–5.2)
Sodium: 138 mmol/L (ref 134–144)
Total Bilirubin: 0.3 mg/dL (ref 0.0–1.2)
Total Protein: 7.3 g/dL (ref 6.0–8.5)

## 2013-07-09 LAB — ANEMIA PROFILE B
Basophils Absolute: 0 10*3/uL (ref 0.0–0.2)
Basos: 0 %
Eos: 2 %
Eosinophils Absolute: 0.2 10*3/uL (ref 0.0–0.4)
Ferritin: 128 ng/mL (ref 15–150)
Folate: 16.1 ng/mL (ref >3.0)
HCT: 40.5 % (ref 34.0–46.6)
Hemoglobin: 13.6 g/dL (ref 11.1–15.9)
Immature Grans (Abs): 0 10*3/uL (ref 0.0–0.1)
Immature Granulocytes: 0 %
Iron Saturation: 14 % — ABNORMAL LOW (ref 15–55)
Iron: 52 ug/dL (ref 35–155)
Lymphocytes Absolute: 2.1 10*3/uL (ref 0.7–3.1)
Lymphs: 24 %
MCH: 29.2 pg (ref 26.6–33.0)
MCHC: 33.6 g/dL (ref 31.5–35.7)
MCV: 87 fL (ref 79–97)
Monocytes Absolute: 0.5 10*3/uL (ref 0.1–0.9)
Monocytes: 6 %
Neutrophils Absolute: 5.9 10*3/uL (ref 1.4–7.0)
Neutrophils Relative %: 68 %
Platelets: 371 10*3/uL (ref 150–379)
RBC: 4.66 x10E6/uL (ref 3.77–5.28)
RDW: 13.6 % (ref 12.3–15.4)
Retic Ct Pct: 1.7 % (ref 0.6–2.6)
TIBC: 364 ug/dL (ref 250–450)
UIBC: 312 ug/dL (ref 150–375)
Vitamin B-12: 826 pg/mL (ref 211–946)
WBC: 8.7 10*3/uL (ref 3.4–10.8)

## 2013-07-09 LAB — VITAMIN D 25 HYDROXY (VIT D DEFICIENCY, FRACTURES): Vit D, 25-Hydroxy: 24.7 ng/mL — ABNORMAL LOW (ref 30.0–100.0)

## 2013-07-09 LAB — LIPID PANEL
Chol/HDL Ratio: 4.5 ratio units — ABNORMAL HIGH (ref 0.0–4.4)
Cholesterol, Total: 170 mg/dL (ref 100–189)
HDL: 38 mg/dL — ABNORMAL LOW (ref >39)
LDL Calculated: 105 mg/dL (ref 0–119)
Triglycerides: 136 mg/dL — ABNORMAL HIGH (ref 0–114)
VLDL Cholesterol Cal: 27 mg/dL (ref 5–40)

## 2013-07-09 LAB — LYME AB/WESTERN BLOT REFLEX
LYME DISEASE AB, QUANT, IGM: <0.8 index (ref 0.00–0.79)
Lyme IgG/IgM Ab: <0.91 {ISR} (ref 0.00–0.90)

## 2013-07-09 LAB — THYROID PANEL WITH TSH
Free Thyroxine Index: 2.2 (ref 1.2–4.9)
T3 Uptake Ratio: 32 % (ref 24–39)
T4, Total: 6.9 ug/dL (ref 4.5–12.0)
TSH: 6.1 u[IU]/mL — ABNORMAL HIGH (ref 0.450–4.500)

## 2013-07-09 LAB — CK: Total CK: 60 U/L (ref 24–173)

## 2013-07-09 LAB — SEDIMENTATION RATE: Sed Rate: 8 mm/hr (ref 0–32)

## 2013-07-09 LAB — ALDOLASE: Aldolase: 5.2 U/L (ref 3.3–10.3)

## 2013-07-14 ENCOUNTER — Telehealth: Payer: Self-pay | Admitting: Family Medicine

## 2013-07-14 ENCOUNTER — Other Ambulatory Visit: Payer: Self-pay | Admitting: Family Medicine

## 2013-07-14 DIAGNOSIS — E039 Hypothyroidism, unspecified: Secondary | ICD-10-CM

## 2013-07-14 DIAGNOSIS — E559 Vitamin D deficiency, unspecified: Secondary | ICD-10-CM | POA: Insufficient documentation

## 2013-07-14 MED ORDER — LEVOTHYROXINE SODIUM 25 MCG PO TABS: 25.0000 ug | ORAL_TABLET | Freq: Every day | ORAL | Status: DC

## 2013-07-14 NOTE — Telephone Encounter (Signed)
PT CALLED 07/14/13 4:40 AWARE THAT PROVIDER HAS TO REVIEW THE LABS AND NURSE WILL CALL BACK 07/15/13

## 2013-07-14 NOTE — Telephone Encounter (Signed)
Left message on pt vm --labs are here but not reviewed and will call tomorrow

## 2013-07-14 NOTE — Progress Notes (Signed)
Quick Note:  Call Patient Labs that are abnormal: Mild hypothyroidism Mild Vit D deficiency.  These both can give muscle aches  The rest are at goal  Recommendations: Start on Vitamin D 1000 units daily OTC Start on low dose levothyroxine. Ordered in EPIC  Will need to recheck levels in 6 weeks. Should have a follow up appointment in a couple of weeks already.  ______

## 2013-07-15 ENCOUNTER — Telehealth: Payer: Self-pay

## 2013-07-15 NOTE — Telephone Encounter (Signed)
Pt notiifed of anemia panel/profile

## 2013-07-21 ENCOUNTER — Encounter: Payer: Self-pay | Admitting: Neurology

## 2013-07-21 ENCOUNTER — Ambulatory Visit (INDEPENDENT_AMBULATORY_CARE_PROVIDER_SITE_OTHER): Payer: BC Managed Care – PPO | Admitting: Neurology

## 2013-07-21 VITALS — BP 126/70 | HR 88 | Temp 98.1°F | Ht 64.0 in | Wt 207.0 lb

## 2013-07-21 DIAGNOSIS — R51 Headache: Secondary | ICD-10-CM

## 2013-07-21 DIAGNOSIS — M549 Dorsalgia, unspecified: Secondary | ICD-10-CM

## 2013-07-21 DIAGNOSIS — M542 Cervicalgia: Secondary | ICD-10-CM

## 2013-07-21 DIAGNOSIS — G894 Chronic pain syndrome: Secondary | ICD-10-CM

## 2013-07-21 DIAGNOSIS — R519 Headache, unspecified: Secondary | ICD-10-CM

## 2013-07-21 LAB — C-REACTIVE PROTEIN: CRP: 0.8 mg/dL (ref 0.5–20.0)

## 2013-07-21 MED ORDER — AMITRIPTYLINE HCL 10 MG PO TABS: 10.0000 mg | ORAL_TABLET | Freq: Every day | ORAL | Status: DC

## 2013-07-21 MED ORDER — PREDNISONE 10 MG PO TABS: ORAL_TABLET | ORAL | Status: DC

## 2013-07-21 NOTE — Progress Notes (Signed)
NEUROLOGY CONSULTATION NOTE  [name] MRN: [id number] DOB: Jul 03, 1991  Referring provider: Dr. Jacelyn Grip Primary care provider: Dr. Jacelyn Grip  Reason for consult:  Headache, diffuse pain  HISTORY OF PRESENT ILLNESS: [name] [name] is a 23 year old right-handed woman with history of anxiety, hypothyroidism and chronic pain who presents for headache and diffuse pain.  Records and images were personally reviewed where available.    Onset:  Since 23 years old, worse over the past year. Location:  Travels around head, except constant on back of head Quality:  Traveling stinging pain around head, constant nonthrobbing pressure pain in back of head Intensity:  4-10/10 Aura:  no Associated symptoms:  Photophobia, phonophobia. Duration:  constant Frequency:  constant Triggers/exacerbating factors:  Loud high-pitch noises, lights Relieving factors: laying down, bath   Past preventative therapy:  Topamax (caused a seizure)  Current abortive therapy: Tylenol, ibuprofen (not really effective and does not take often).  She has taken her mom's hydrocodone and tramadol, which help Current preventative therapy:  none  Sleep hygiene:  Can't sleep due to pain Stress/depression:  Panic attacks (onset since pain) Family history of headache:  Mom has history of migraine and fibromyalgia  Around the same time, she developed diffuse body pain.  She does report neck pain that can shoot down the spine and sometimes down the left posterior thigh to above the knee.  Sometimes, she notes a pressure across her upper back, like she is wearing a TENS unit, as well as tingling.  She also notes pain in the chest and sternum.  She has significant pain in her joints and throughout her body.  She denies warm joints, fevers or rash.  No associated numbness.  PAST MEDICAL HISTORY: Past Medical History  Diagnosis Date  . Anxiety   . Hypothyroidism     PAST SURGICAL HISTORY: No past surgical history on  file.  MEDICATIONS: Current Outpatient Prescriptions on File Prior to Visit  Medication Sig Dispense Refill  . acetaminophen (TYLENOL) 325 MG tablet Take 650 mg by mouth every 6 (six) hours as needed for headache.      . albuterol (PROVENTIL) (2.5 MG/3ML) 0.083% nebulizer solution Take 2.5 mg by nebulization every 6 (six) hours as needed for wheezing or shortness of breath.      . ferrous sulfate 325 (65 FE) MG tablet Take 325 mg by mouth daily with breakfast.      . fluticasone (FLONASE) 50 MCG/ACT nasal spray Place 2 sprays into both nostrils daily.  16 g  6  . levothyroxine (SYNTHROID, LEVOTHROID) 25 MCG tablet Take 1 tablet (25 mcg total) by mouth daily before breakfast.  30 tablet  5  . mirtazapine (REMERON) 7.5 MG tablet Take 1 tablet (7.5 mg total) by mouth at bedtime.  30 tablet  2   No current facility-administered medications on file prior to visit.    ALLERGIES: Allergies  Allergen Reactions  . Celexa [Citalopram Hydrobromide] Diarrhea  . Topamax [Topiramate] Nausea And Vomiting    Unable to speak    FAMILY HISTORY: No family history on file.  SOCIAL HISTORY: History   Social History  . Marital Status: Married    Spouse Name: N/A    Number of Children: N/A  . Years of Education: N/A   Occupational History  . Not on file.   Social History Main Topics  . Smoking status: Never Smoker   . Smokeless tobacco: Never Used  . Alcohol Use: No     Comment: occ  .  Drug Use: No  . Sexual Activity: Not on file   Other Topics Concern  . Not on file   Social History Narrative  . No narrative on file    REVIEW OF SYSTEMS: Constitutional: No fevers, chills, or sweats, no generalized fatigue, change in appetite Eyes: No visual changes, double vision, eye pain Ear, nose and throat: No hearing loss, ear pain, nasal congestion, sore throat Cardiovascular: No chest pain, palpitations Respiratory:  No shortness of breath at rest or with exertion,  wheezes GastrointestinaI: No nausea, vomiting, diarrhea, abdominal pain, fecal incontinence Genitourinary:  No dysuria, urinary retention or frequency Musculoskeletal:  No neck pain, back pain Integumentary: No rash, pruritus, skin lesions Neurological: as above Psychiatric: No depression, insomnia, anxiety Endocrine: No palpitations, fatigue, diaphoresis, mood swings, change in appetite, change in weight, increased thirst Hematologic/Lymphatic:  No anemia, purpura, petechiae. Allergic/Immunologic: no itchy/runny eyes, nasal congestion, recent allergic reactions, rashes  PHYSICAL EXAM: Filed Vitals:   07/21/13 1445  BP: 126/70  Pulse: 88  Temp: 98.1 F (36.7 C)   General: No acute distress Head:  Normocephalic/atraumatic Neck: supple, bilateral paraspinal tenderness, full range of motion Back: Bilateral paraspinal tenderness.  Exhibits tenderness to palpation along the sternum, scapula, hips, knees Heart: regular rate and rhythm Lungs: Clear to auscultation bilaterally. Vascular: No carotid bruits. Neurological Exam: Mental status: alert and oriented to person, place, and time, speech fluent and not dysarthric, language intact. Cranial nerves: CN I: not tested CN II: pupils equal, round and reactive to light, visual fields intact, fundi unremarkable. CN III, IV, VI:  full range of motion, no nystagmus, no ptosis CN V: facial sensation intact CN VII: upper and lower face symmetric CN VIII: hearing intact CN IX, X: gag intact, uvula midline CN XI: sternocleidomastoid and trapezius muscles intact CN XII: tongue midline Bulk & Tone: normal, no fasciculations. Motor: 5/5 throughout Sensation: pinprick and vibration intact Deep Tendon Reflexes: 2+ throughout, toes down Finger to nose testing: normal Heel to shin: normal Gait: normal stance and stride. Able to walk in tandem. Romberg negative.  IMPRESSION: 1.  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  Chronic pain syndrome.   PLAN: 1.  Will start amitriptyline 69m at bedtime. 2.  Refer to PT for neck and spine 3.  Check inflammatory markers such as ANA, ESR, CRP and RF.  Consider rheumatology referral for diffuse pain. 4.  Prednisone taper to break cycle of headache.  Thank you for allowing me to take part in the care of this patient.  AMetta Clines DO  CC:  FUlanda Edison MD

## 2013-07-21 NOTE — Patient Instructions (Addendum)
It sounds like your headaches are migraine and related to the neck.   1.  We will start amitriptyline 10mg  at bedtime to help reduce frequency of headaches. Side effects include sleepiness, dizziness and dry mouth. 2.  We will start a prednisone taper.  Take 6 pills on day one, then 5 pills on day two, then 4 pills on day three, then 3 pills on day four, then 2 pills on day five, then 1 pill on day 6, then stop.  Do not take NSAIDs such as ibuprofen or naproxen while taking the prednisone. 3.  We will refer you to physical therapy for neck and back. 4.  We will check blood work looking for a cause of generalized pain. 5.  Follow up in 3 months.  Call in 4 weeks with update.   We will [place]. They will call you to schedule an appointment.    [phone]

## 2013-07-22 LAB — RHEUMATOID FACTOR: Rhuematoid fact SerPl-aCnc: <10 IU/mL (ref <=14)

## 2013-07-22 LAB — SEDIMENTATION RATE: Sed Rate: 17 mm/hr (ref 0–22)

## 2013-07-23 LAB — ANTI-NUCLEAR AB-TITER (ANA TITER): ANA Titer 1: 1:80 {titer} — ABNORMAL HIGH

## 2013-07-23 LAB — ANA: Anti Nuclear Antibody(ANA): POSITIVE — AB

## 2013-07-24 ENCOUNTER — Ambulatory Visit: Payer: BC Managed Care – PPO | Attending: Neurology | Admitting: Physical Therapy

## 2013-07-24 ENCOUNTER — Encounter: Payer: Self-pay | Admitting: Neurology

## 2013-07-24 DIAGNOSIS — M545 Low back pain, unspecified: Secondary | ICD-10-CM | POA: Insufficient documentation

## 2013-07-24 DIAGNOSIS — IMO0001 Reserved for inherently not codable concepts without codable children: Secondary | ICD-10-CM | POA: Insufficient documentation

## 2013-07-24 DIAGNOSIS — E039 Hypothyroidism, unspecified: Secondary | ICD-10-CM | POA: Insufficient documentation

## 2013-07-24 DIAGNOSIS — M542 Cervicalgia: Secondary | ICD-10-CM | POA: Insufficient documentation

## 2013-07-24 DIAGNOSIS — M546 Pain in thoracic spine: Secondary | ICD-10-CM | POA: Insufficient documentation

## 2013-07-24 DIAGNOSIS — R5381 Other malaise: Secondary | ICD-10-CM | POA: Insufficient documentation

## 2013-07-25 MED ORDER — CYCLOBENZAPRINE HCL 5 MG PO TABS: 5.0000 mg | ORAL_TABLET | Freq: Every evening | ORAL | Status: DC | PRN

## 2013-07-25 NOTE — Telephone Encounter (Signed)
RX sent to patient's pharmacy. Replied to patient via mychart making her aware that the RX has been called in and to let us know how she is doing in two weeks per Dr Everlena CooperJaffe.

## 2013-07-29 ENCOUNTER — Ambulatory Visit: Payer: BC Managed Care – PPO | Admitting: Physical Therapy

## 2013-07-31 ENCOUNTER — Ambulatory Visit: Payer: BC Managed Care – PPO | Admitting: Physical Therapy

## 2013-08-01 ENCOUNTER — Telehealth: Payer: Self-pay | Admitting: Neurology

## 2013-08-01 NOTE — Telephone Encounter (Signed)
Message copied by Benay SpiceSTONEMAN, Rishard Delange H on Fri Aug 01, 2013  4:27 PM ------      Message from: JAFFE, ADAM R      Created: Fri Aug 01, 2013 12:19 PM       Blood work looks okay.  There is a positive ANA, which is a non-specific inflammatory marker.  However, it is only weakly positive, which is typically not clinically relevant.  Also, other inflammatory markers are normal.      ----- Message -----         From: [facility] Interface         Sent: 07/21/2013   6:11 PM           To: Cira ServantAdam Robert Jaffe, DO                   ------

## 2013-08-01 NOTE — Telephone Encounter (Signed)
Left the patient a message as per Dr. Everlena CooperJaffe below.

## 2013-08-04 ENCOUNTER — Encounter: Payer: BC Managed Care – PPO | Admitting: Physical Therapy

## 2013-08-06 ENCOUNTER — Ambulatory Visit: Payer: BC Managed Care – PPO | Admitting: Physical Therapy

## 2013-08-07 ENCOUNTER — Ambulatory Visit: Payer: BC Managed Care – PPO | Admitting: Family Medicine

## 2013-08-08 ENCOUNTER — Encounter: Payer: BC Managed Care – PPO | Admitting: Physical Therapy

## 2013-08-12 ENCOUNTER — Ambulatory Visit: Payer: BC Managed Care – PPO | Admitting: Physical Therapy

## 2013-08-14 ENCOUNTER — Ambulatory Visit: Payer: BC Managed Care – PPO | Admitting: Physical Therapy

## 2013-08-15 ENCOUNTER — Ambulatory Visit (INDEPENDENT_AMBULATORY_CARE_PROVIDER_SITE_OTHER): Payer: BC Managed Care – PPO | Admitting: Family Medicine

## 2013-08-15 ENCOUNTER — Encounter: Payer: Self-pay | Admitting: Family Medicine

## 2013-08-15 VITALS — BP 138/92 | HR 103 | Temp 100.6°F | Ht 64.0 in | Wt 214.8 lb

## 2013-08-15 DIAGNOSIS — E039 Hypothyroidism, unspecified: Secondary | ICD-10-CM

## 2013-08-15 DIAGNOSIS — F339 Major depressive disorder, recurrent, unspecified: Secondary | ICD-10-CM | POA: Insufficient documentation

## 2013-08-15 DIAGNOSIS — F411 Generalized anxiety disorder: Secondary | ICD-10-CM

## 2013-08-15 DIAGNOSIS — F32A Depression, unspecified: Secondary | ICD-10-CM | POA: Insufficient documentation

## 2013-08-15 DIAGNOSIS — F329 Major depressive disorder, single episode, unspecified: Secondary | ICD-10-CM

## 2013-08-15 DIAGNOSIS — R635 Abnormal weight gain: Secondary | ICD-10-CM | POA: Insufficient documentation

## 2013-08-15 DIAGNOSIS — F3289 Other specified depressive episodes: Secondary | ICD-10-CM

## 2013-08-15 DIAGNOSIS — F419 Anxiety disorder, unspecified: Secondary | ICD-10-CM

## 2013-08-15 DIAGNOSIS — E559 Vitamin D deficiency, unspecified: Secondary | ICD-10-CM | POA: Insufficient documentation

## 2013-08-15 NOTE — Patient Instructions (Signed)
      Dr Tristyn Pharris's Recommendations  For nutrition information, I recommend books:  1).Eat to Live by Dr Joel Fuhrman. 2).Prevent and Reverse Heart Disease by Dr Caldwell Esselstyn. 3) Dr Neal Barnard's Book:  Program to Reverse Diabetes  Exercise recommendations are:  If unable to walk, then the patient can exercise in a chair 3 times a day. By flapping arms like a bird gently and raising legs outwards to the front.  If ambulatory, the patient can go for walks for 30 minutes 3 times a week. Then increase the intensity and duration as tolerated.  Goal is to try to attain exercise frequency to 5 times a week.  If applicable: Best to perform resistance exercises (machines or weights) 2 days a week and cardio type exercises 3 days per week.  

## 2013-08-15 NOTE — Progress Notes (Signed)
Patient ID: [name] [name], female   DOB: 16-Apr-1991, 23 y.o.   MRN: [id number] SUBJECTIVE: CC: Chief Complaint  Patient presents with  . Follow-up    4 week follow up statate LMP was dec 24 and did home pregnancy test which she says were negative.     HPI: bReakfast : granola and milk, fruit Lunch: salad: grilled chicken with lemon garlic  Dressing Dinner: chicken, carrot mushroom broccoli  Snacks pretzel snacks.   Seen by Neurology. Has a low titer positive ANA probably of no significance.  Feels bad all the time. Denies suicidal ideation. Sees a therapist for counselling every 2 weeks. No hallucinations. Came for follow up. Gaining weight. Claims she eats healthy( but reviewing her diet it is high in sugars/ carbs.) Taking the vitamin D and the low dose levothyroxine.  Past Medical History  Diagnosis Date  . Hypothyroidism   . Anxiety   . Vitamin D deficiency   . Depression    No past surgical history on file. History   Social History  . Marital Status: Married    Spouse Name: N/A    Number of Children: N/A  . Years of Education: N/A   Occupational History  . Not on file.   Social History Main Topics  . Smoking status: Never Smoker   . Smokeless tobacco: Never Used  . Alcohol Use: No     Comment: occ  . Drug Use: No  . Sexual Activity: Not on file   Other Topics Concern  . Not on file   Social History Narrative  . No narrative on file   Family History  Problem Relation Age of Onset  . Fibromyalgia Mother    Current Outpatient Prescriptions on File Prior to Visit  Medication Sig Dispense Refill  . amitriptyline (ELAVIL) 10 MG tablet Take 1 tablet (10 mg total) by mouth at bedtime.  30 tablet  3  . cholecalciferol (VITAMIN D) 1000 UNITS tablet Take 1,000 Units by mouth daily.      . cyclobenzaprine (FLEXERIL) 5 MG tablet Take 1 tablet (5 mg total) by mouth at bedtime as needed for muscle spasms.  30 tablet  0  . ferrous sulfate 325 (65 FE) MG tablet  Take 325 mg by mouth daily with breakfast.      . fluticasone (FLONASE) 50 MCG/ACT nasal spray Place 2 sprays into both nostrils daily.  16 g  6  . levothyroxine (SYNTHROID, LEVOTHROID) 25 MCG tablet Take 1 tablet (25 mcg total) by mouth daily before breakfast.  30 tablet  5  . mirtazapine (REMERON) 7.5 MG tablet Take 1 tablet (7.5 mg total) by mouth at bedtime.  30 tablet  2  . vitamin B-12 (CYANOCOBALAMIN) 1000 MCG tablet Take 1,000 mcg by mouth daily.      Marland Kitchen acetaminophen (TYLENOL) 325 MG tablet Take 650 mg by mouth every 6 (six) hours as needed for headache.      . albuterol (PROVENTIL) (2.5 MG/3ML) 0.083% nebulizer solution Take 2.5 mg by nebulization every 6 (six) hours as needed for wheezing or shortness of breath.       No current facility-administered medications on file prior to visit.   Allergies  Allergen Reactions  . Celexa [Citalopram Hydrobromide] Diarrhea  . Topamax [Topiramate] Nausea And Vomiting    Unable to speak   Immunization History  Administered Date(s) Administered  . Tdap 07/08/2013   Prior to Admission medications   Medication Sig Start Date End Date Taking? Authorizing Provider  acetaminophen (TYLENOL) 325  MG tablet Take 650 mg by mouth every 6 (six) hours as needed for headache.    Historical Provider, MD  albuterol (PROVENTIL) (2.5 MG/3ML) 0.083% nebulizer solution Take 2.5 mg by nebulization every 6 (six) hours as needed for wheezing or shortness of breath.    Historical Provider, MD  amitriptyline (ELAVIL) 10 MG tablet Take 1 tablet (10 mg total) by mouth at bedtime. 07/21/13   Adam Gus Rankin, DO  cholecalciferol (VITAMIN D) 1000 UNITS tablet Take 1,000 Units by mouth daily.    Historical Provider, MD  cyclobenzaprine (FLEXERIL) 5 MG tablet Take 1 tablet (5 mg total) by mouth at bedtime as needed for muscle spasms. 07/25/13   Adam Gus Rankin, DO  ferrous sulfate 325 (65 FE) MG tablet Take 325 mg by mouth daily with breakfast.    Historical Provider, MD   fluticasone (FLONASE) 50 MCG/ACT nasal spray Place 2 sprays into both nostrils daily. 07/08/13   Ileana Ladd, MD  levothyroxine (SYNTHROID, LEVOTHROID) 25 MCG tablet Take 1 tablet (25 mcg total) by mouth daily before breakfast. 07/14/13   Ileana Ladd, MD  mirtazapine (REMERON) 7.5 MG tablet Take 1 tablet (7.5 mg total) by mouth at bedtime. 07/08/13   Ileana Ladd, MD  predniSONE (DELTASONE) 10 MG tablet Take 6tabs on day1, then 5tabs on day2, then 4tabs on day3, then 3tabs on day4, then 2tabs on day5, then 1tab on day6, then stop. 07/21/13   Adam Gus Rankin, DO  vitamin B-12 (CYANOCOBALAMIN) 1000 MCG tablet Take 1,000 mcg by mouth daily.    Historical Provider, MD     ROS: As above in the HPI. All other systems are stable or negative.  OBJECTIVE: APPEARANCE:  Patient in no acute distress.The patient appeared well nourished and normally developed. Acyanotic. Waist: VITAL SIGNS:BP 138/92  Pulse 103  Temp(Src) 100.6 F (38.1 C) (Oral)  Ht 5\' 4"  (1.626 m)  Wt 214 lb 12.8 oz (97.433 kg)  BMI 36.85 kg/m2  LMP 07/09/2013   SKIN: warm and  Dry without overt rashes, tattoos and scars  HEAD and Neck: without JVD, Head and scalp: normal Eyes:No scleral icterus. Fundi normal, eye movements normal. Ears: Auricle normal, canal normal, Tympanic membranes normal, insufflation normal. Nose: normal Throat: normal Neck & thyroid: normal  CHEST & LUNGS: Chest wall: normal Lungs: Clear  CVS: Reveals the PMI to be normally located. Regular rhythm, First and Second Heart sounds are normal,  absence of murmurs, rubs or gallops. Peripheral vasculature: Radial pulses: normal Dorsal pedis pulses: normal Posterior pulses: normal  ABDOMEN:  Appearance: normal Benign, no organomegaly, no masses, no Abdominal Aortic enlargement. No Guarding , no rebound. No Bruits. Bowel sounds: normal  RECTAL: N/A GU: N/A  EXTREMETIES: nonedematous.  MUSCULOSKELETAL:  Spine: normal Joints:  intact  NEUROLOGIC: oriented to time,place and person; nonfocal. Strength is normal Sensory is normal Reflexes are normal Cranial Nerves are normal.  ASSESSMENT: Hypothyroid - Plan: Thyroid Panel With TSH  Unspecified vitamin D deficiency - Plan: Vit D  25 hydroxy (rtn osteoporosis monitoring)  Anxiety  Depression  Weight gain  PLAN: See her therapist: Peg Elisabeth Most Suspect her problems has a strong underlying psychological component.      Dr Woodroe Mode Recommendations  For nutrition information, I recommend books:  1).Eat to Live by Dr Monico Hoar. 2).Prevent and Reverse Heart Disease by Dr Suzzette Righter. 3) Dr Katherina Right Book:  Program to Reverse Diabetes  Exercise recommendations are:  If unable to walk, then the patient can  exercise in a chair 3 times a day. By flapping arms like a bird gently and raising legs outwards to the front.  If ambulatory, the patient can go for walks for 30 minutes 3 times a week. Then increase the intensity and duration as tolerated.  Goal is to try to attain exercise frequency to 5 times a week.  If applicable: Best to perform resistance exercises (machines or weights) 2 days a week and cardio type exercises 3 days per week.   Orders Placed This Encounter  Procedures  . Thyroid Panel With TSH  . Vit D  25 hydroxy (rtn osteoporosis monitoring)   No orders of the defined types were placed in this encounter.   Medications Discontinued During This Encounter  Medication Reason  . predniSONE (DELTASONE) 10 MG tablet Completed Course  keep follow up with the neurologist Supportive therapy. Return in about 2 months (around 10/13/2013) for Recheck medical problems.  Clarine Elrod P. [place] Charon, M.D.

## 2013-08-16 LAB — THYROID PANEL WITH TSH
Free Thyroxine Index: 1.7 (ref 1.2–4.9)
T3 Uptake Ratio: 30 % (ref 24–39)
T4, Total: 5.8 ug/dL (ref 4.5–12.0)
TSH: 3.53 u[IU]/mL (ref 0.450–4.500)

## 2013-08-16 LAB — VITAMIN D 25 HYDROXY (VIT D DEFICIENCY, FRACTURES): Vit D, 25-Hydroxy: 24.7 ng/mL — ABNORMAL LOW (ref 30.0–100.0)

## 2013-08-16 NOTE — Progress Notes (Signed)
Quick Note:  Call Patient Labs that are abnormal: Vit D still low  The rest are at goal  Recommendations: Double the amount of Vitamin D she is taking. Keep the follow up appointment.   ______

## 2013-08-17 DIAGNOSIS — K900 Celiac disease: Secondary | ICD-10-CM

## 2013-08-17 HISTORY — DX: Celiac disease: K90.0

## 2013-08-24 ENCOUNTER — Encounter: Payer: Self-pay | Admitting: Neurology

## 2013-08-27 ENCOUNTER — Encounter: Payer: Self-pay | Admitting: Family Medicine

## 2013-08-28 ENCOUNTER — Telehealth: Payer: Self-pay | Admitting: Family Medicine

## 2013-08-28 NOTE — Telephone Encounter (Signed)
Spoke with patient and advised her she needed to call obgyn that she prefers and needed to call them today

## 2013-09-01 ENCOUNTER — Encounter: Payer: Self-pay | Admitting: Family Medicine

## 2013-09-24 ENCOUNTER — Other Ambulatory Visit: Payer: Self-pay

## 2013-09-24 LAB — OB RESULTS CONSOLE ABO/RH: RH Type: POSITIVE

## 2013-09-24 LAB — OB RESULTS CONSOLE HEPATITIS B SURFACE ANTIGEN: HEP B S AG: NEGATIVE

## 2013-09-24 LAB — OB RESULTS CONSOLE RUBELLA ANTIBODY, IGM: RUBELLA: IMMUNE

## 2013-09-24 LAB — OB RESULTS CONSOLE RPR: RPR: NONREACTIVE

## 2013-09-24 LAB — OB RESULTS CONSOLE HIV ANTIBODY (ROUTINE TESTING): HIV: NONREACTIVE

## 2013-09-24 LAB — OB RESULTS CONSOLE ANTIBODY SCREEN: ANTIBODY SCREEN: POSITIVE

## 2013-09-24 LAB — OB RESULTS CONSOLE GC/CHLAMYDIA
CHLAMYDIA, DNA PROBE: NEGATIVE
Gonorrhea: NEGATIVE

## 2013-10-08 ENCOUNTER — Telehealth: Payer: Self-pay | Admitting: Hematology and Oncology

## 2013-10-08 NOTE — Telephone Encounter (Signed)
LEFT MESSAGE FOR PATIENT TO RETURN CALL TON SCHEDULE NEW PATIENT APPT.

## 2013-10-09 ENCOUNTER — Telehealth: Payer: Self-pay | Admitting: Hematology and Oncology

## 2013-10-09 NOTE — Telephone Encounter (Signed)
S/W PATIENT AND GAVE NEW PATIENT APPT FOR 03/31 @ 9:45 W/DR. GORSUCH REFERRING DR. Aundra MilletMEGAN MORRIS DX- PREGNANT ABN LABS  WELCOME PACKET MAILED.

## 2013-10-09 NOTE — Telephone Encounter (Signed)
C/D 10/09/13 for appt.10/14/13 °

## 2013-10-14 ENCOUNTER — Ambulatory Visit: Payer: BC Managed Care – PPO

## 2013-10-14 ENCOUNTER — Encounter: Payer: Self-pay | Admitting: Hematology and Oncology

## 2013-10-14 ENCOUNTER — Ambulatory Visit: Payer: BC Managed Care – PPO | Admitting: Family Medicine

## 2013-10-14 ENCOUNTER — Telehealth: Payer: Self-pay | Admitting: Hematology and Oncology

## 2013-10-14 ENCOUNTER — Ambulatory Visit (HOSPITAL_BASED_OUTPATIENT_CLINIC_OR_DEPARTMENT_OTHER): Payer: BC Managed Care – PPO | Admitting: Hematology and Oncology

## 2013-10-14 VITALS — BP 136/73 | HR 83 | Temp 98.3°F | Resp 20 | Ht 64.0 in | Wt 202.3 lb

## 2013-10-14 DIAGNOSIS — O9989 Other specified diseases and conditions complicating pregnancy, childbirth and the puerperium: Secondary | ICD-10-CM

## 2013-10-14 DIAGNOSIS — R894 Abnormal immunological findings in specimens from other organs, systems and tissues: Secondary | ICD-10-CM

## 2013-10-14 DIAGNOSIS — R768 Other specified abnormal immunological findings in serum: Secondary | ICD-10-CM

## 2013-10-14 DIAGNOSIS — E039 Hypothyroidism, unspecified: Secondary | ICD-10-CM

## 2013-10-14 DIAGNOSIS — O99891 Other specified diseases and conditions complicating pregnancy: Secondary | ICD-10-CM

## 2013-10-14 DIAGNOSIS — M329 Systemic lupus erythematosus, unspecified: Secondary | ICD-10-CM | POA: Insufficient documentation

## 2013-10-14 HISTORY — DX: Other specified abnormal immunological findings in serum: R76.8

## 2013-10-14 NOTE — Telephone Encounter (Signed)
[facility] APPT SCHEDULE FOR APRIL °

## 2013-10-14 NOTE — Progress Notes (Signed)
Checked in new patient with no financial issues. She has appt card and has not been out of the country. °

## 2013-10-14 NOTE — Progress Notes (Signed)
[facility] CONSULT [facility]  Patient Care Team: Ernestina Pennaonald W Moore, MD as PCP - General (Family Medicine)  CHIEF COMPLAINTS/PURPOSE OF CONSULTATION:  Positive antibody screening test  HISTORY OF PRESENTING ILLNESS:  [name] [name] 23 y.o. female is here because of abnormal labs. The patient is currently [redacted] week pregnant. 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 As part of her routine test, antibody screen test was done and pan-reactivity was seen, worrisome for warm autoimmune hemolytic anemia. The patient stated she has passage of dark urine and has chronic fatigue. She was diagnosed with positive ANA test; rheumatology consultation is pending. She has skin rashes and join pain affecting her knees and hands.  MEDICAL HISTORY:  Past Medical History  Diagnosis Date  . Hypothyroidism   . Anxiety   . Vitamin D deficiency   . Depression   . 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 No pertinent past surgical history.  SOCIAL HISTORY: History   Social History  . Marital Status: Married    Spouse Name: N/A    Number of Children: N/A  . Years of Education: N/A   Occupational History  . Not on file.   Social History Main Topics  . Smoking status: Never Smoker   . Smokeless tobacco: Never Used  . Alcohol Use: No     Comment: occ  . Drug Use: No  . Sexual Activity: Not on file   Other Topics Concern  . Not on file   Social History Narrative  . No narrative on file    FAMILY HISTORY: Family History  Problem Relation Age of Onset  . Fibromyalgia Mother     ALLERGIES:  is allergic to celexa and topamax.  MEDICATIONS:  Current Outpatient Prescriptions  Medication Sig Dispense Refill  . acetaminophen (TYLENOL) 325 MG tablet Take 650 mg by mouth every 6 (six) hours as needed for headache.      . levothyroxine (SYNTHROID, LEVOTHROID) 25 MCG tablet Take 1 tablet (25 mcg total) by mouth daily before  breakfast.  30 tablet  5  . Prenatal Vit-Fe Fumarate-FA (PRENATAL FA PO) Take 1 tablet by mouth daily.      . progesterone (PROMETRIUM) 200 MG capsule Take 200 mg by mouth daily.      . fluticasone (FLONASE) 50 MCG/ACT nasal spray Place 2 sprays into both nostrils daily.  16 g  6   No current facility-administered medications for this visit.    REVIEW OF SYSTEMS:   Constitutional: Denies fevers, chills or abnormal night sweats Eyes: Denies blurriness of vision, double vision or watery eyes Ears, nose, mouth, throat, and face: Denies mucositis or sore throat Respiratory: Denies cough, dyspnea or wheezes Cardiovascular: Denies palpitation, chest discomfort or lower extremity swelling Gastrointestinal:  Denies nausea, heartburn or change in bowel habits Lymphatics: Denies new lymphadenopathy or easy bruising Neurological:Denies numbness, tingling or new weaknesses Behavioral/Psych: Mood is stable, no new changes  All other systems were reviewed with the patient and are negative.  PHYSICAL EXAMINATION: ECOG PERFORMANCE STATUS: 1 - Symptomatic but completely ambulatory  Filed Vitals:   10/14/13 0956  BP: 136/73  Pulse: 83  Temp: 98.3 F (36.8 C)  Resp: 20   Filed Weights   10/14/13 0956  Weight: 202 lb 4.8 oz (91.763 kg)    GENERAL:alert, no distress and comfortable. She is morbidly obese SKIN: butterfly like rash on her face, none elsewhere EYES: normal, conjunctiva are pink and non-injected, sclera clear OROPHARYNX:no exudate, no  erythema and lips, buccal mucosa, and tongue normal  NECK: supple, thyroid normal size, non-tender, without nodularity LYMPH:  no palpable lymphadenopathy in the cervical, axillary or inguinal LUNGS: clear to auscultation and percussion with normal breathing effort HEART: regular rate & rhythm and no murmurs and no lower extremity edema ABDOMEN:abdomen soft, non-tender and normal bowel sounds Musculoskeletal:no cyanosis of digits and no clubbing. No  joint swelling is noted PSYCH: alert & oriented x 3 with fluent speech NEURO: no focal motor/sensory deficits  LABORATORY DATA:  I have reviewed the data as listed Lab Results  Component Value Date   WBC 8.7 07/08/2013   HGB 13.6 07/08/2013   HCT 40.5 07/08/2013   MCV 87 07/08/2013   PLT 371 07/08/2013   Lab Results  Component Value Date   NA 138 07/08/2013   K 4.7 07/08/2013   CL 99 07/08/2013   CO2 21 07/08/2013   I reviewed outside records pertaining to history above ASSESSMENT & PLAN:  #1 Positive antibody testing #2 Positive ANA testing #3 Pregnancy #4 No signs of anemia She tested positive for antibody screening on routine blood test without signs of anemia or hemolysis. I suspect this is related to the spectrum of autoimmune disease. There is no indication to start her on treatment now. I recommend she contacts her provider to expedite her rheumatology appointment. I want to observe her closely. I will make her a return appointment in 1 month with repeat lab work, history and physical examination.   Orders Placed This Encounter  Procedures  . Lactate dehydrogenase    Standing Status: Future     Number of Occurrences:      Standing Expiration Date: 10/14/2014  . CBC & Diff and Retic    Standing Status: Future     Number of Occurrences:      Standing Expiration Date: 10/14/2014  . Direct antiglobulin test    Standing Status: Future     Number of Occurrences:      Standing Expiration Date: 10/14/2014    All questions were answered. The patient knows to call the clinic with any problems, questions or concerns. I spent 40 minutes counseling the patient face to face. The total time spent in the appointment was 55 minutes and more than 50% was on counseling.     [facility], Sonnia Strong, MD 10/14/2013 7:03 PM

## 2013-10-16 ENCOUNTER — Telehealth: Payer: Self-pay | Admitting: Neurology

## 2013-10-16 NOTE — Telephone Encounter (Signed)
Pt called to cancel 3 month follow up scheduled for 10/20/13 w/ Dr. Everlena CooperJaffe. She did not want to r/s [name] [name]

## 2013-10-20 ENCOUNTER — Ambulatory Visit: Payer: BC Managed Care – PPO | Admitting: Neurology

## 2013-11-10 ENCOUNTER — Other Ambulatory Visit (HOSPITAL_COMMUNITY): Payer: Self-pay | Admitting: Obstetrics & Gynecology

## 2013-11-10 DIAGNOSIS — Z3689 Encounter for other specified antenatal screening: Secondary | ICD-10-CM

## 2013-11-10 DIAGNOSIS — Z8659 Personal history of other mental and behavioral disorders: Secondary | ICD-10-CM

## 2013-11-10 DIAGNOSIS — M797 Fibromyalgia: Secondary | ICD-10-CM

## 2013-11-10 DIAGNOSIS — E039 Hypothyroidism, unspecified: Secondary | ICD-10-CM

## 2013-11-12 ENCOUNTER — Other Ambulatory Visit: Payer: BC Managed Care – PPO

## 2013-11-12 ENCOUNTER — Ambulatory Visit: Payer: BC Managed Care – PPO | Admitting: Hematology and Oncology

## 2013-12-02 ENCOUNTER — Ambulatory Visit (HOSPITAL_COMMUNITY)
Admission: RE | Admit: 2013-12-02 | Discharge: 2013-12-02 | Disposition: A | Discharge status: Home / Self Care | Payer: BC Managed Care – PPO | Source: Ambulatory Visit | Attending: Obstetrics & Gynecology | Admitting: Obstetrics & Gynecology

## 2013-12-02 ENCOUNTER — Encounter (HOSPITAL_COMMUNITY): Payer: Self-pay

## 2013-12-02 DIAGNOSIS — Z3689 Encounter for other specified antenatal screening: Secondary | ICD-10-CM

## 2013-12-02 DIAGNOSIS — E039 Hypothyroidism, unspecified: Secondary | ICD-10-CM | POA: Insufficient documentation

## 2013-12-02 DIAGNOSIS — E079 Disorder of thyroid, unspecified: Secondary | ICD-10-CM | POA: Insufficient documentation

## 2013-12-02 DIAGNOSIS — O9989 Other specified diseases and conditions complicating pregnancy, childbirth and the puerperium: Secondary | ICD-10-CM

## 2013-12-02 DIAGNOSIS — Z8659 Personal history of other mental and behavioral disorders: Secondary | ICD-10-CM | POA: Insufficient documentation

## 2013-12-02 DIAGNOSIS — O99891 Other specified diseases and conditions complicating pregnancy: Secondary | ICD-10-CM | POA: Insufficient documentation

## 2013-12-02 DIAGNOSIS — M797 Fibromyalgia: Secondary | ICD-10-CM

## 2013-12-02 DIAGNOSIS — M3500 Sicca syndrome, unspecified: Secondary | ICD-10-CM | POA: Insufficient documentation

## 2013-12-02 DIAGNOSIS — O99280 Endocrine, nutritional and metabolic diseases complicating pregnancy, unspecified trimester: Secondary | ICD-10-CM

## 2013-12-03 LAB — SJOGRENS SYNDROME-B EXTRACTABLE NUCLEAR ANTIBODY: SSB (LA) (ENA) ANTIBODY, IGG: NEGATIVE

## 2013-12-03 LAB — SJOGRENS SYNDROME-A EXTRACTABLE NUCLEAR ANTIBODY: SSA (RO) (ENA) ANTIBODY, IGG: NEGATIVE

## 2013-12-05 ENCOUNTER — Telehealth (HOSPITAL_COMMUNITY): Payer: Self-pay | Admitting: *Deleted

## 2013-12-05 ENCOUNTER — Other Ambulatory Visit: Payer: BC Managed Care – PPO

## 2013-12-05 ENCOUNTER — Telehealth: Payer: Self-pay | Admitting: Hematology and Oncology

## 2013-12-05 ENCOUNTER — Ambulatory Visit: Payer: BC Managed Care – PPO | Admitting: Hematology and Oncology

## 2013-12-05 NOTE — Telephone Encounter (Signed)
returned pt call to r/s appt due to stomach bug....done...pt aware of new d.t

## 2013-12-05 NOTE — Telephone Encounter (Signed)
Left message that labs drawn in our clinic are normal.  Left our number to call back with any concerns or questions.

## 2013-12-15 DIAGNOSIS — O24419 Gestational diabetes mellitus in pregnancy, unspecified control: Secondary | ICD-10-CM

## 2013-12-15 HISTORY — DX: Gestational diabetes mellitus in pregnancy, unspecified control: O24.419

## 2013-12-23 ENCOUNTER — Telehealth: Payer: Self-pay | Admitting: Hematology and Oncology

## 2013-12-23 NOTE — Telephone Encounter (Signed)
per pt rqst r/s appt to july 2015

## 2013-12-25 ENCOUNTER — Ambulatory Visit: Payer: BC Managed Care – PPO | Admitting: Hematology and Oncology

## 2013-12-25 ENCOUNTER — Other Ambulatory Visit: Payer: BC Managed Care – PPO

## 2014-01-26 ENCOUNTER — Other Ambulatory Visit: Payer: BC Managed Care – PPO

## 2014-01-26 ENCOUNTER — Ambulatory Visit: Payer: BC Managed Care – PPO | Admitting: Hematology and Oncology

## 2014-03-29 ENCOUNTER — Inpatient Hospital Stay (HOSPITAL_COMMUNITY)
Admission: AD | Admit: 2014-03-29 | Discharge: 2014-03-30 | Disposition: A | Discharge status: Home / Self Care | Payer: BC Managed Care – PPO | Source: Ambulatory Visit | Attending: Obstetrics & Gynecology | Admitting: Obstetrics & Gynecology

## 2014-03-29 ENCOUNTER — Encounter (HOSPITAL_COMMUNITY): Payer: Self-pay | Admitting: *Deleted

## 2014-03-29 DIAGNOSIS — R109 Unspecified abdominal pain: Secondary | ICD-10-CM | POA: Diagnosis present

## 2014-03-29 DIAGNOSIS — Z87891 Personal history of nicotine dependence: Secondary | ICD-10-CM | POA: Diagnosis not present

## 2014-03-29 DIAGNOSIS — O139 Gestational [pregnancy-induced] hypertension without significant proteinuria, unspecified trimester: Secondary | ICD-10-CM | POA: Insufficient documentation

## 2014-03-29 DIAGNOSIS — O133 Gestational [pregnancy-induced] hypertension without significant proteinuria, third trimester: Secondary | ICD-10-CM

## 2014-03-29 HISTORY — DX: Reserved for concepts with insufficient information to code with codable children: IMO0002

## 2014-03-29 HISTORY — DX: Warm autoimmune hemolytic anemia: D59.11

## 2014-03-29 HISTORY — DX: Systemic lupus erythematosus, unspecified: M32.9

## 2014-03-29 HISTORY — DX: Other autoimmune hemolytic anemias: D59.1

## 2014-03-29 LAB — PROTEIN / CREATININE RATIO, URINE
Creatinine, Urine: 93.77 mg/dL
Protein Creatinine Ratio: 0.09 (ref 0.00–0.15)
TOTAL PROTEIN, URINE: 8.8 mg/dL

## 2014-03-29 LAB — COMPREHENSIVE METABOLIC PANEL
ALT: 14 U/L (ref 0–35)
AST: 14 U/L (ref 0–37)
Albumin: 2.7 g/dL — ABNORMAL LOW (ref 3.5–5.2)
Alkaline Phosphatase: 131 U/L — ABNORMAL HIGH (ref 39–117)
Anion gap: 14 (ref 5–15)
BUN: 6 mg/dL (ref 6–23)
CALCIUM: 8.7 mg/dL (ref 8.4–10.5)
CO2: 21 mEq/L (ref 19–32)
CREATININE: 0.6 mg/dL (ref 0.50–1.10)
Chloride: 102 mEq/L (ref 96–112)
GFR calc non Af Amer: >90 mL/min (ref >90)
GLUCOSE: 119 mg/dL — AB (ref 70–99)
Potassium: 4.4 mEq/L (ref 3.7–5.3)
SODIUM: 137 meq/L (ref 137–147)
Total Bilirubin: <0.2 mg/dL — ABNORMAL LOW (ref 0.3–1.2)
Total Protein: 6.3 g/dL (ref 6.0–8.3)

## 2014-03-29 LAB — URINALYSIS, ROUTINE W REFLEX MICROSCOPIC
Bilirubin Urine: NEGATIVE
Glucose, UA: NEGATIVE mg/dL
Hgb urine dipstick: NEGATIVE
KETONES UR: NEGATIVE mg/dL
NITRITE: NEGATIVE
PH: 7 (ref 5.0–8.0)
Protein, ur: NEGATIVE mg/dL
Specific Gravity, Urine: 1.01 (ref 1.005–1.030)
UROBILINOGEN UA: 0.2 mg/dL (ref 0.0–1.0)

## 2014-03-29 LAB — CBC
HCT: 37.8 % (ref 36.0–46.0)
HEMOGLOBIN: 12.6 g/dL (ref 12.0–15.0)
MCH: 28.3 pg (ref 26.0–34.0)
MCHC: 33.3 g/dL (ref 30.0–36.0)
MCV: 84.8 fL (ref 78.0–100.0)
Platelets: 304 10*3/uL (ref 150–400)
RBC: 4.46 MIL/uL (ref 3.87–5.11)
RDW: 14.6 % (ref 11.5–15.5)
WBC: 11.5 10*3/uL — ABNORMAL HIGH (ref 4.0–10.5)

## 2014-03-29 LAB — URINE MICROSCOPIC-ADD ON

## 2014-03-29 MED ORDER — GI COCKTAIL ~~LOC~~
30.0000 mL | Freq: Once | ORAL | Status: AC
  Administered 2014-03-29: 30 mL via ORAL
  Filled 2014-03-29: qty 30

## 2014-03-29 MED ORDER — ACETAMINOPHEN 500 MG PO TABS
1000.0000 mg | ORAL_TABLET | Freq: Once | ORAL | Status: AC
  Administered 2014-03-29: 1000 mg via ORAL
  Filled 2014-03-29: qty 2

## 2014-03-29 NOTE — Discharge Instructions (Signed)

## 2014-03-29 NOTE — MAU Note (Signed)
Pt stated she has been having cramping on and off all afternoon. C/o headache as well. Denies leaking or bleeding reports her mucus plug has come out this afternoon

## 2014-03-29 NOTE — MAU Provider Note (Signed)
History     CSN: [id number]  Arrival date and time: 03/29/14 2023   First Provider Initiated Contact with Patient 03/29/14 2114      Chief Complaint  Patient presents with  . Contractions   HPI  [name] [name] is a 23 y.o. a G1P0 at [redacted]w[redacted]d who presents today with lower abdominal and upper abdominal pain. She states that it has been going on all day. She also has a headache, and she states "it feels like it is going to be a migraine". She has not taken anything for it at this time. She denies any VB or LOF and confirms fetal movement. She has had some elevated blood pressures in the office.   Past Medical History  Diagnosis Date  . Hypothyroidism   . Anxiety   . Vitamin D deficiency   . Depression   . 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 Fibromyalgia   . Lupus   . Warm antibody hemolytic anemia     Past Surgical History  Procedure Laterality Date  . No past surgeries      Family History  Problem Relation Age of Onset  . Fibromyalgia Mother     History  Substance Use Topics  . Smoking status: Former Smoker    Quit date: 07/29/2013  . Smokeless tobacco: Never Used  . Alcohol Use: No     Comment: occ    Allergies:  Allergies  Allergen Reactions  . Celexa [Citalopram Hydrobromide] Diarrhea  . Topamax [Topiramate] Nausea And Vomiting    Unable to speak    Prescriptions prior to admission  Medication Sig Dispense Refill  . acetaminophen (TYLENOL) 325 MG tablet Take 650 mg by mouth every 6 (six) hours as needed for headache.      . fluticasone (FLONASE) 50 MCG/ACT nasal spray Place 2 sprays into both nostrils daily.  16 g  6  . levothyroxine (SYNTHROID, LEVOTHROID) 25 MCG tablet Take 1 tablet (25 mcg total) by mouth daily before breakfast.  30 tablet  5  . Prenatal Vit-Fe Fumarate-FA (PRENATAL FA PO) Take 1 tablet by mouth daily.      . progesterone (PROMETRIUM) 200 MG capsule Take 200 mg by mouth daily.        ROS Physical Exam   Blood pressure  147/86, pulse 86, temperature 98.2 F (36.8 C), last menstrual period 07/09/2013.  Physical Exam  Nursing note and vitals reviewed. Constitutional: She is oriented to person, place, and time. She appears well-developed and well-nourished. No distress.  Cardiovascular: Normal rate.   Respiratory: Effort normal.  GI: Soft. There is no tenderness. There is no rebound.  Genitourinary:  Cervix: 1/60/-3   Neurological: She is alert and oriented to person, place, and time. She has normal reflexes.  No clonus   Skin: Skin is warm and dry.  Psychiatric: She has a normal mood and affect.    MAU Course  Procedures  Results for orders placed during the hospital encounter of 03/29/14 (from the past 24 hour(s))  URINALYSIS, ROUTINE W REFLEX MICROSCOPIC     Status: Abnormal   Collection Time    03/29/14  8:33 PM      Result Value Ref Range   Color, Urine YELLOW  YELLOW   APPearance CLEAR  CLEAR   Specific Gravity, Urine 1.010  1.005 - 1.030   pH 7.0  5.0 - 8.0   Glucose, UA NEGATIVE  NEGATIVE mg/dL   Hgb urine dipstick NEGATIVE  NEGATIVE   Bilirubin Urine NEGATIVE  NEGATIVE  Ketones, ur NEGATIVE  NEGATIVE mg/dL   Protein, ur NEGATIVE  NEGATIVE mg/dL   Urobilinogen, UA 0.2  0.0 - 1.0 mg/dL   Nitrite NEGATIVE  NEGATIVE   Leukocytes, UA SMALL (*) NEGATIVE  URINE MICROSCOPIC-ADD ON     Status: None   Collection Time    03/29/14  8:33 PM      Result Value Ref Range   Squamous Epithelial / LPF RARE  RARE   WBC, UA 0-2  <3 WBC/hpf   RBC / HPF 0-2  <3 RBC/hpf   Bacteria, UA RARE  RARE  PROTEIN / CREATININE RATIO, URINE     Status: None   Collection Time    03/29/14  8:33 PM      Result Value Ref Range   Creatinine, Urine 93.77     Total Protein, Urine 8.8     PROTEIN CREATININE RATIO 0.09  0.00 - 0.15  CBC     Status: Abnormal   Collection Time    03/29/14  9:45 PM      Result Value Ref Range   WBC 11.5 (*) 4.0 - 10.5 K/uL   RBC 4.46  3.87 - 5.11 MIL/uL   Hemoglobin 12.6  12.0 -  15.0 g/dL   HCT 16.1  09.6 - 04.5 %   MCV 84.8  78.0 - 100.0 fL   MCH 28.3  26.0 - 34.0 pg   MCHC 33.3  30.0 - 36.0 g/dL   RDW 40.9  81.1 - 91.4 %   Platelets 304  150 - 400 K/uL  COMPREHENSIVE METABOLIC PANEL     Status: Abnormal   Collection Time    03/29/14  9:45 PM      Result Value Ref Range   Sodium 137  137 - 147 mEq/L   Potassium 4.4  3.7 - 5.3 mEq/L   Chloride 102  96 - 112 mEq/L   CO2 21  19 - 32 mEq/L   Glucose, Bld 119 (*) 70 - 99 mg/dL   BUN 6  6 - 23 mg/dL   Creatinine, Ser 7.82  0.50 - 1.10 mg/dL   Calcium 8.7  8.4 - 95.6 mg/dL   Total Protein 6.3  6.0 - 8.3 g/dL   Albumin 2.7 (*) 3.5 - 5.2 g/dL   AST 14  0 - 37 U/L   ALT 14  0 - 35 U/L   Alkaline Phosphatase 131 (*) 39 - 117 U/L   Total Bilirubin <0.2 (*) 0.3 - 1.2 mg/dL   GFR calc non Af Amer >90  >90 mL/min   GFR calc Af Amer >90  >90 mL/min   Anion gap 14  5 - 15   2330: Patient reports that the epigastric pain has gone away after GI cocktail. Her headache has also improved with tylenol down to a 6/10 from 9/10.  2350: D/W Dr. Rana Snare, ok for dc home. Will have patient FU this week in the office for BP check.   Assessment and Plan   1. Transient hypertension of pregnancy in third trimester   pre-eclampsia signs reviewed Return to MAU as needed Fetal kick counts  Follow-up Information   Follow up with MORRIS, MEGAN, DO. (Follow up this week for a blood pressure check. )    Specialty:  Obstetrics and Gynecology   Contact information:   [address], [address], [address] [place] [address] [phone]        Tawnya Crook 03/29/2014, 9:30 PM

## 2014-04-03 ENCOUNTER — Ambulatory Visit (INDEPENDENT_AMBULATORY_CARE_PROVIDER_SITE_OTHER): Payer: BC Managed Care – PPO | Admitting: Internal Medicine

## 2014-04-03 ENCOUNTER — Encounter: Payer: Self-pay | Admitting: Internal Medicine

## 2014-04-03 VITALS — BP 130/85 | HR 87 | Temp 98.0°F | Ht 64.0 in | Wt 220.0 lb

## 2014-04-03 DIAGNOSIS — Z23 Encounter for immunization: Secondary | ICD-10-CM

## 2014-04-03 DIAGNOSIS — W57XXXA Bitten or stung by nonvenomous insect and other nonvenomous arthropods, initial encounter: Principal | ICD-10-CM

## 2014-04-03 DIAGNOSIS — S30861A Insect bite (nonvenomous) of abdominal wall, initial encounter: Secondary | ICD-10-CM

## 2014-04-03 DIAGNOSIS — S30860A Insect bite (nonvenomous) of lower back and pelvis, initial encounter: Secondary | ICD-10-CM

## 2014-04-03 LAB — CBC WITH DIFFERENTIAL/PLATELET
BASOS PCT: 0 % (ref 0–1)
Basophils Absolute: 0 10*3/uL (ref 0.0–0.1)
Eosinophils Absolute: 0.1 10*3/uL (ref 0.0–0.7)
Eosinophils Relative: 1 % (ref 0–5)
HEMATOCRIT: 37.5 % (ref 36.0–46.0)
Hemoglobin: 12.7 g/dL (ref 12.0–15.0)
LYMPHS PCT: 18 % (ref 12–46)
Lymphs Abs: 1.5 10*3/uL (ref 0.7–4.0)
MCH: 28 pg (ref 26.0–34.0)
MCHC: 33.9 g/dL (ref 30.0–36.0)
MCV: 82.8 fL (ref 78.0–100.0)
MONOS PCT: 10 % (ref 3–12)
Monocytes Absolute: 0.9 10*3/uL (ref 0.1–1.0)
NEUTROS ABS: 6.1 10*3/uL (ref 1.7–7.7)
NEUTROS PCT: 71 % (ref 43–77)
Platelets: 289 10*3/uL (ref 150–400)
RBC: 4.53 MIL/uL (ref 3.87–5.11)
RDW: 14.4 % (ref 11.5–15.5)
WBC: 8.6 10*3/uL (ref 4.0–10.5)

## 2014-04-03 LAB — COMPLETE METABOLIC PANEL WITH GFR
ALK PHOS: 130 U/L — AB (ref 39–117)
ALT: 15 U/L (ref 0–35)
AST: 15 U/L (ref 0–37)
Albumin: 3.4 g/dL — ABNORMAL LOW (ref 3.5–5.2)
BILIRUBIN TOTAL: 0.2 mg/dL (ref 0.2–1.2)
BUN: 5 mg/dL — AB (ref 6–23)
CO2: 21 meq/L (ref 19–32)
Calcium: 9 mg/dL (ref 8.4–10.5)
Chloride: 106 mEq/L (ref 96–112)
Creat: 0.55 mg/dL (ref 0.50–1.10)
GLUCOSE: 87 mg/dL (ref 70–99)
Potassium: 4.1 mEq/L (ref 3.5–5.3)
SODIUM: 135 meq/L (ref 135–145)
TOTAL PROTEIN: 6.1 g/dL (ref 6.0–8.3)

## 2014-04-03 LAB — C-REACTIVE PROTEIN: CRP: 1.4 mg/dL — ABNORMAL HIGH (ref <0.60)

## 2014-04-03 MED ORDER — CEFUROXIME AXETIL 500 MG PO TABS: 500.0000 mg | ORAL_TABLET | Freq: Two times a day (BID) | ORAL | Status: DC

## 2014-04-04 LAB — SEDIMENTATION RATE: Sed Rate: 38 mm/hr — ABNORMAL HIGH (ref 0–22)

## 2014-04-06 LAB — OB RESULTS CONSOLE GBS: GBS: POSITIVE

## 2014-04-06 LAB — B. BURGDORFI ANTIBODIES BY WB
B BURGDORFERI IGG ABS (IB): NEGATIVE
B BURGDORFERI IGM ABS (IB): NEGATIVE

## 2014-04-07 LAB — ROCKY MTN SPOTTED FVR AB, IGM-BLOOD: ROCKY MTN SPOTTED FEVER, IGM: 0.43 IV

## 2014-04-17 IMAGING — CT CT HEAD W/O CM
2 series · 16 of 30 positions shown, 20 images · non-contrast
Comparison: None available for comparison at time of study
interpretation.

CLINICAL DATA: Chronic headache, not relieved with over the counter
medication.

EXAM:
CT HEAD WITHOUT CONTRAST
TECHNIQUE: Contiguous axial images were obtained from the base of the skull
through the vertex without intravenous contrast.

[Series 2: head w/o · axial · non-contrast · 0.49mm/px · z∈[+99,+229]mm · 13 of 32 slices shown, 17 images]
[im 3/32  brain]
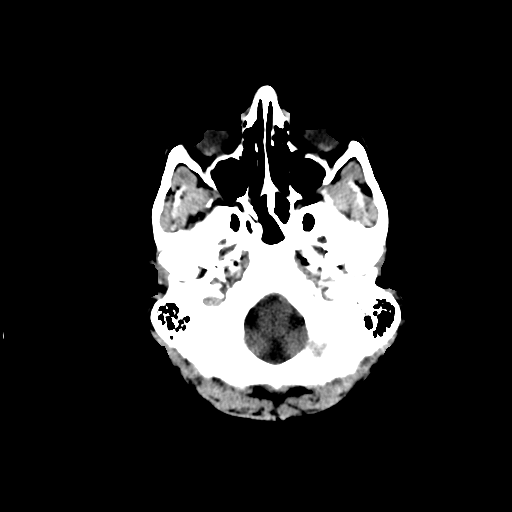
[im 3/32  bone]
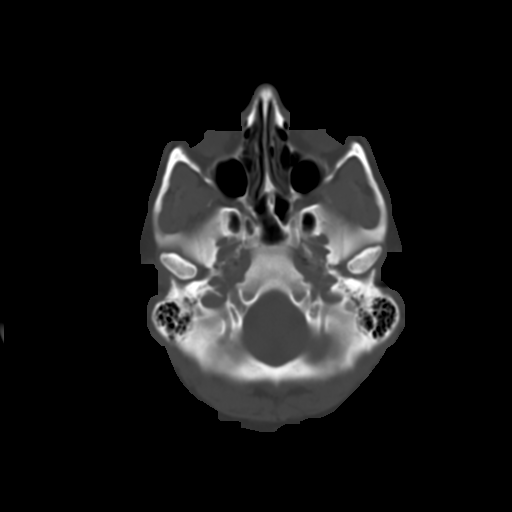
[im 5/32  brain]
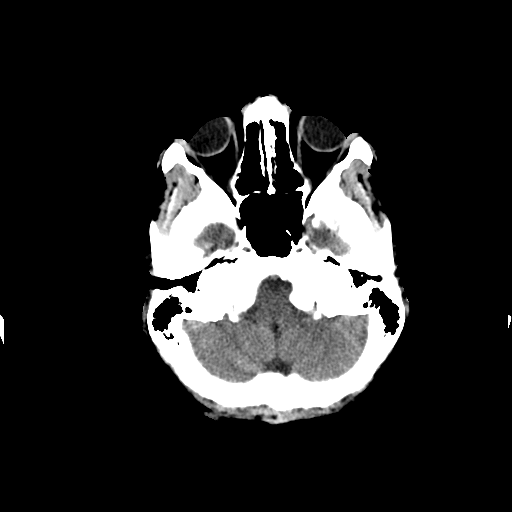
[im 7/32  brain]
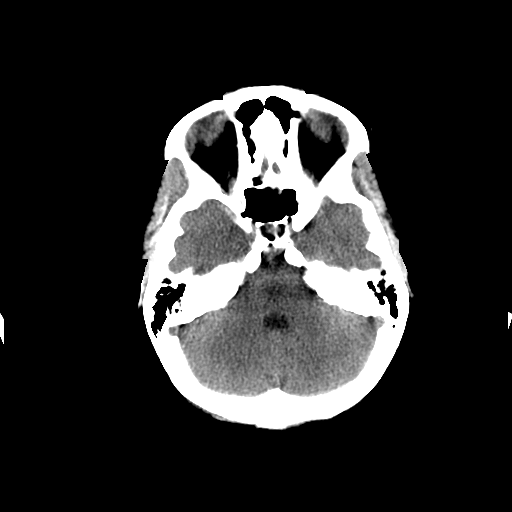
[im 9/32  brain]
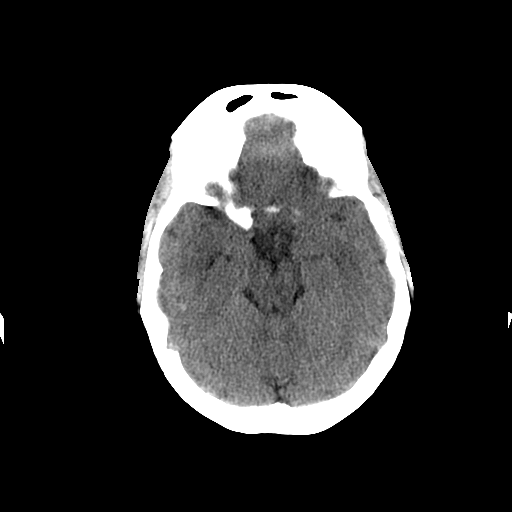
[im 12/32  brain]
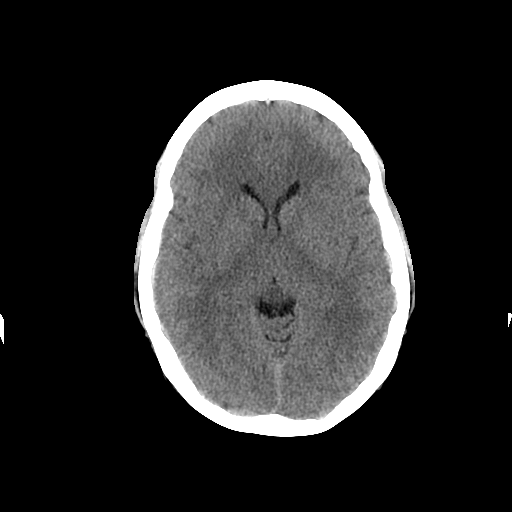
[im 12/32  bone]
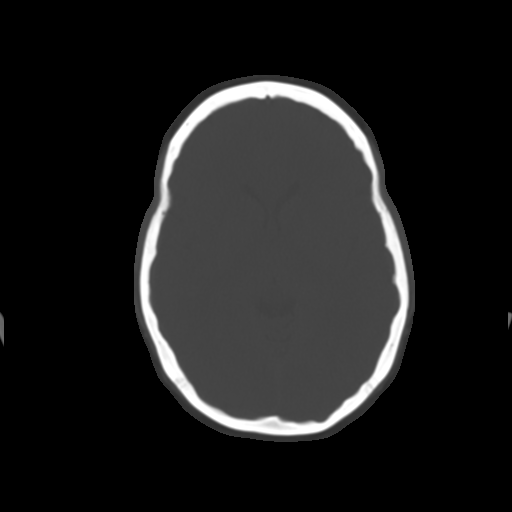
[im 14/32  brain]
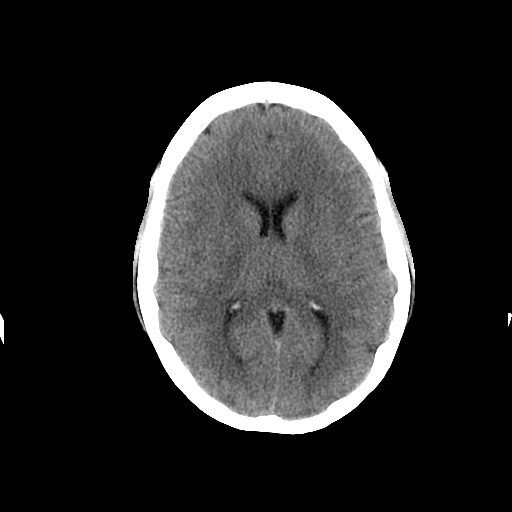
[im 16/32  brain]
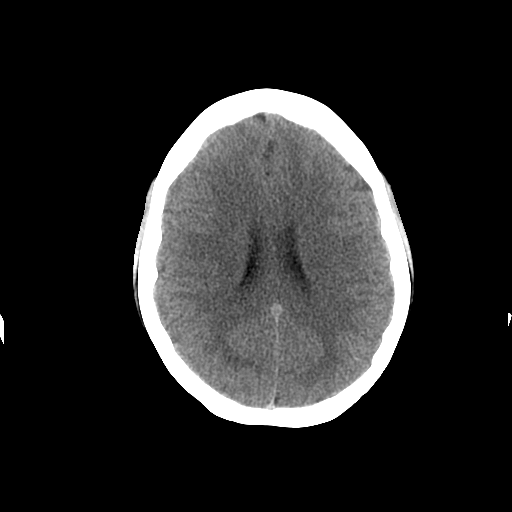
[im 18/32  brain]
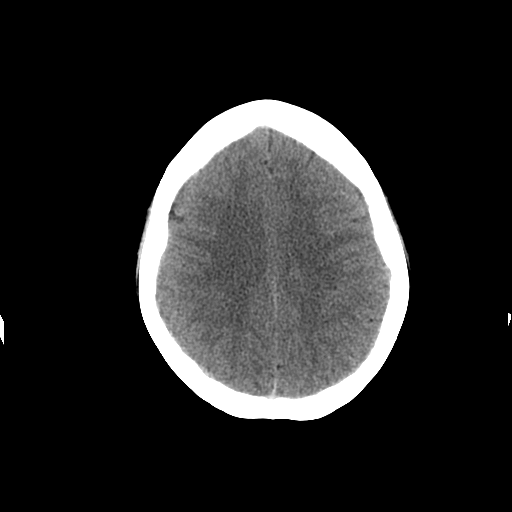
[im 20/32  brain]
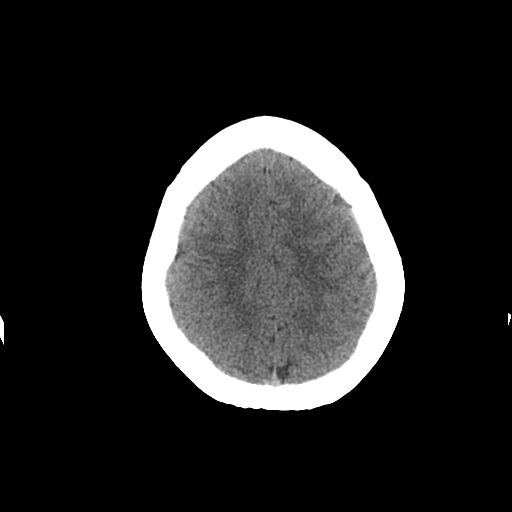
[im 20/32  bone]
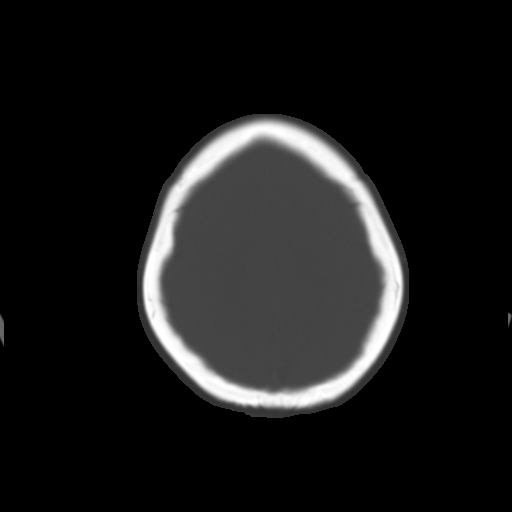
[im 23/32  brain]
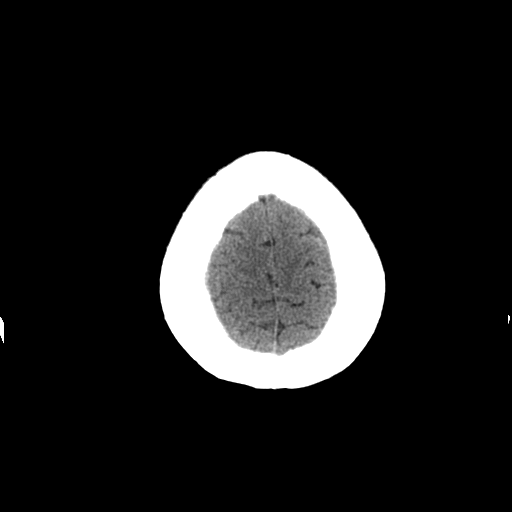
[im 25/32  brain]
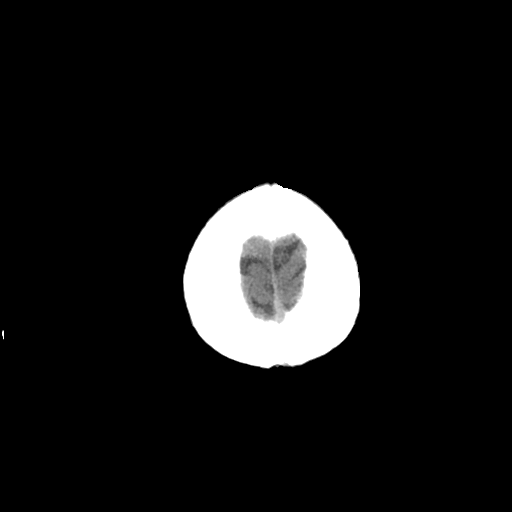
[im 27/32  brain]
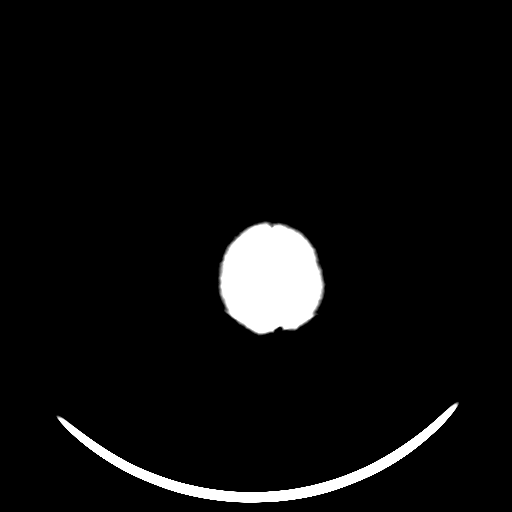
[im 29/32  brain]
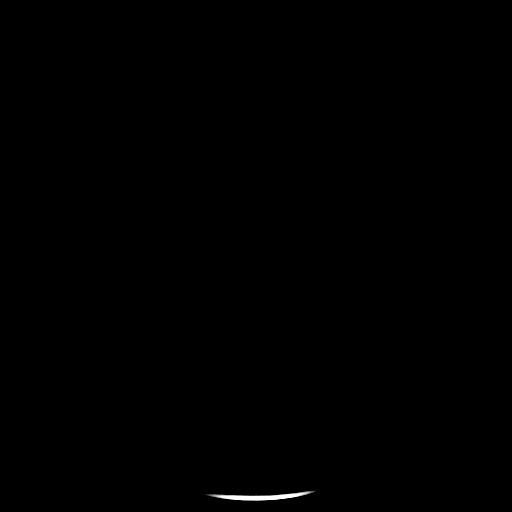
[im 29/32  bone]
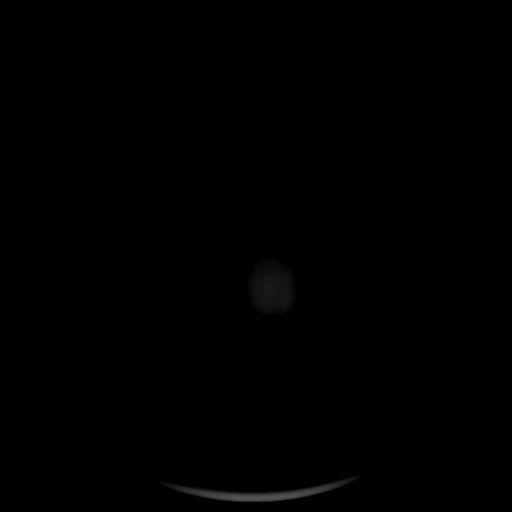

[Series 3: head w/o bone · axial · non-contrast · 0.49mm/px · z∈[+99,+144]mm · 3 of 32 slices shown]
[im 3/32  bone]
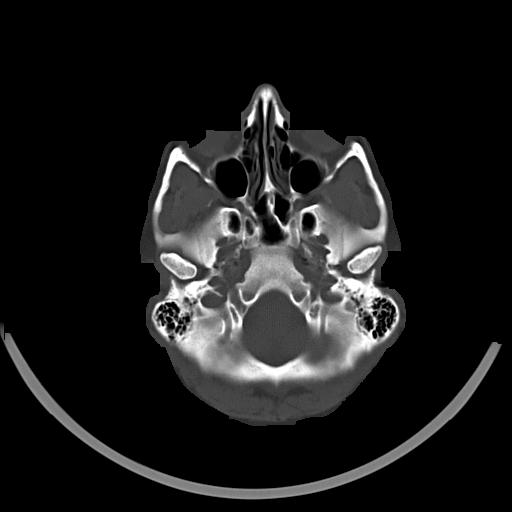
[im 7/32  bone]
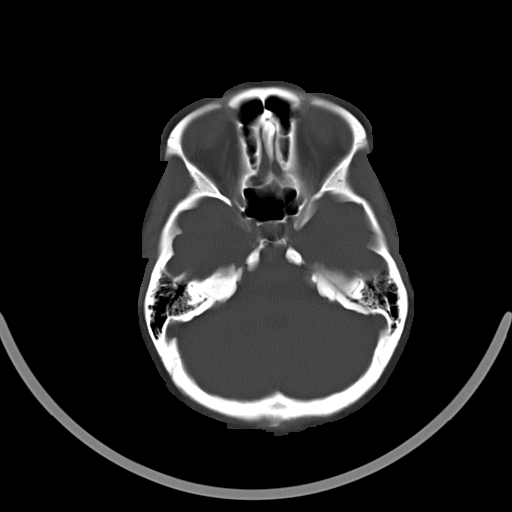
[im 12/32  bone]
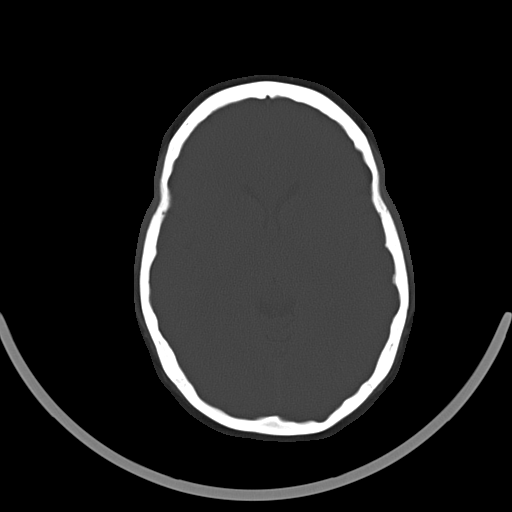

[16 of 30 positions shown; findings below may reference images not displayed]

FINDINGS: The ventricles and sulci are normal. No intraparenchymal hemorrhage,
mass effect nor midline shift. No acute large vascular territory
infarcts.

No abnormal extra-axial fluid collections. Basal cisterns are
patent.

No skull fracture. Visualized paranasal sinuses and mastoid
air-cells are well-aerated. The included ocular globes and orbital
contents are non-suspicious.
IMPRESSION: No acute intracranial process.  Normal noncontrast CT of the brain.

  By: Chema Ahmadi

## 2014-04-20 ENCOUNTER — Inpatient Hospital Stay (HOSPITAL_COMMUNITY)
Admission: AD | Admit: 2014-04-20 | Discharge: 2014-04-20 | Disposition: A | Discharge status: Home / Self Care | Payer: BC Managed Care – PPO | Source: Ambulatory Visit | Attending: Obstetrics and Gynecology | Admitting: Obstetrics and Gynecology

## 2014-04-20 ENCOUNTER — Encounter (HOSPITAL_COMMUNITY): Payer: Self-pay | Admitting: *Deleted

## 2014-04-20 DIAGNOSIS — Z3A38 38 weeks gestation of pregnancy: Secondary | ICD-10-CM | POA: Diagnosis not present

## 2014-04-20 DIAGNOSIS — Z87891 Personal history of nicotine dependence: Secondary | ICD-10-CM | POA: Diagnosis not present

## 2014-04-20 DIAGNOSIS — O133 Gestational [pregnancy-induced] hypertension without significant proteinuria, third trimester: Secondary | ICD-10-CM

## 2014-04-20 DIAGNOSIS — O10013 Pre-existing essential hypertension complicating pregnancy, third trimester: Secondary | ICD-10-CM | POA: Insufficient documentation

## 2014-04-20 DIAGNOSIS — R0989 Other specified symptoms and signs involving the circulatory and respiratory systems: Secondary | ICD-10-CM

## 2014-04-20 DIAGNOSIS — R030 Elevated blood-pressure reading, without diagnosis of hypertension: Secondary | ICD-10-CM | POA: Diagnosis present

## 2014-04-20 LAB — CBC
HEMATOCRIT: 38.6 % (ref 36.0–46.0)
Hemoglobin: 13.2 g/dL (ref 12.0–15.0)
MCH: 28.9 pg (ref 26.0–34.0)
MCHC: 34.2 g/dL (ref 30.0–36.0)
MCV: 84.5 fL (ref 78.0–100.0)
Platelets: 318 10*3/uL (ref 150–400)
RBC: 4.57 MIL/uL (ref 3.87–5.11)
RDW: 14.6 % (ref 11.5–15.5)
WBC: 11.4 10*3/uL — ABNORMAL HIGH (ref 4.0–10.5)

## 2014-04-20 LAB — URINE MICROSCOPIC-ADD ON

## 2014-04-20 LAB — COMPREHENSIVE METABOLIC PANEL
ALT: 12 U/L (ref 0–35)
ANION GAP: 14 (ref 5–15)
AST: 13 U/L (ref 0–37)
Albumin: 2.6 g/dL — ABNORMAL LOW (ref 3.5–5.2)
Alkaline Phosphatase: 151 U/L — ABNORMAL HIGH (ref 39–117)
BUN: 6 mg/dL (ref 6–23)
CO2: 20 meq/L (ref 19–32)
Calcium: 8.9 mg/dL (ref 8.4–10.5)
Chloride: 101 mEq/L (ref 96–112)
Creatinine, Ser: 0.62 mg/dL (ref 0.50–1.10)
GFR calc non Af Amer: >90 mL/min (ref >90)
GLUCOSE: 83 mg/dL (ref 70–99)
Potassium: 3.8 mEq/L (ref 3.7–5.3)
Sodium: 135 mEq/L — ABNORMAL LOW (ref 137–147)
Total Bilirubin: <0.2 mg/dL — ABNORMAL LOW (ref 0.3–1.2)
Total Protein: 6.3 g/dL (ref 6.0–8.3)

## 2014-04-20 LAB — URINALYSIS, ROUTINE W REFLEX MICROSCOPIC
BILIRUBIN URINE: NEGATIVE
Glucose, UA: NEGATIVE mg/dL
Ketones, ur: NEGATIVE mg/dL
Leukocytes, UA: NEGATIVE
NITRITE: NEGATIVE
PROTEIN: NEGATIVE mg/dL
Specific Gravity, Urine: <1.005 — ABNORMAL LOW (ref 1.005–1.030)
Urobilinogen, UA: 0.2 mg/dL (ref 0.0–1.0)
pH: 6 (ref 5.0–8.0)

## 2014-04-20 LAB — PROTEIN / CREATININE RATIO, URINE
Creatinine, Urine: 58.5 mg/dL
Protein Creatinine Ratio: 0.2 — ABNORMAL HIGH (ref 0.00–0.15)
TOTAL PROTEIN, URINE: 11.7 mg/dL

## 2014-04-20 NOTE — MAU Note (Signed)
Pt states had high bp in office today. Also having abd pain and dizziness. Feels like her blood sugar is elevated.

## 2014-04-20 NOTE — Discharge Instructions (Signed)

## 2014-04-20 NOTE — MAU Provider Note (Signed)
History     CSN: [id number]  Arrival date and time: 04/20/14 1446   None     Chief Complaint  Patient presents with  . Hypertension   HPI This is a 23 y.o. female at 1w4dwho presents for evaluation of elevated BP in office today. States is now having some abdominal pain and dizziness. Denies headache or visual changes.   RN  Note:  Pt states had high bp in office today. Also having abd pain and dizziness. Feels like her blood sugar is elevated.       OB History   Grav Para Term Preterm Abortions TAB SAB Ect Mult Living   3 0   2  2         Past Medical History  Diagnosis Date  . Hypothyroidism   . Anxiety   . Vitamin D deficiency   . Depression   . 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 Fibromyalgia   . Lupus   . Warm antibody hemolytic anemia   . Diabetes mellitus without complication     Past Surgical History  Procedure Laterality Date  . No past surgeries      Family History  Problem Relation Age of Onset  . Fibromyalgia Mother     History  Substance Use Topics  . Smoking status: Former Smoker    Quit date: 07/29/2013  . Smokeless tobacco: Never Used  . Alcohol Use: No     Comment: occ    Allergies:  Allergies  Allergen Reactions  . Celexa [Citalopram Hydrobromide] Diarrhea and Other (See Comments)    Pt states that this medication caused her limbs to go numb.   . Ranitidine Diarrhea and Other (See Comments)    Pt states that this causes severe stomach pain.   . Topamax [Topiramate] Nausea And Vomiting and Other (See Comments)    Pt states that this medication caused seizures and she was unable to speak.     Prescriptions prior to admission  Medication Sig Dispense Refill  . pantoprazole (PROTONIX) 40 MG tablet Take 40 mg by mouth daily.      .Marland Kitchenacetaminophen (TYLENOL) 325 MG tablet Take 650 mg by mouth every 6 (six) hours as needed for mild pain or headache.       . albuterol (PROVENTIL HFA;VENTOLIN HFA) 108 (90 BASE) MCG/ACT inhaler  Inhale 2 puffs into the lungs every 6 (six) hours as needed for wheezing or shortness of breath.      . cefUROXime (CEFTIN) 500 MG tablet Take 1 tablet (500 mg total) by mouth 2 (two) times daily with a meal.  28 tablet  0  . docusate sodium (COLACE) 100 MG capsule Take 300 mg by mouth daily as needed for mild constipation.      . Prenatal Vit-Fe Fumarate-FA (PRENATAL FA PO) Take 1 tablet by mouth daily.      .Marland Kitchensenna (SENOKOT) 8.6 MG tablet Take 2-3 tablets by mouth daily as needed for constipation.        Review of Systems  Constitutional: Negative for fever, chills and malaise/fatigue.  Eyes: Negative for blurred vision and double vision.  Gastrointestinal: Positive for abdominal pain. Negative for nausea, vomiting, diarrhea and constipation.  Genitourinary: Negative for dysuria.  Neurological: Positive for dizziness. Negative for sensory change, speech change, focal weakness and headaches.  Psychiatric/Behavioral: Negative for depression.   Physical Exam   Blood pressure 124/77, pulse 94, temperature 98.1 F (36.7 C), temperature source Oral, resp. rate 16, last menstrual period 07/09/2013.  Filed Vitals:   04/20/14 1648 04/20/14 1703 04/20/14 1718 04/20/14 1735  BP: 143/84 136/82 124/81 128/81  Pulse: 89 88 90 98  Temp:      TempSrc:      Resp:        Physical Exam  Constitutional: She is oriented to person, place, and time. She appears well-developed and well-nourished. No distress.  HENT:  Head: Normocephalic.  Cardiovascular: Normal rate.   Respiratory: Effort normal.  GI: Soft. She exhibits no distension. There is no tenderness. There is no rebound and no guarding.  Musculoskeletal: Normal range of motion. She exhibits no edema.  Neurological: She is alert and oriented to person, place, and time. She displays normal reflexes. She exhibits normal muscle tone.  Skin: Skin is warm and dry.  Psychiatric: She has a normal mood and affect.    MAU Course   Procedures  MDM Results for orders placed during the hospital encounter of 04/20/14 (from the past 72 hour(s))  PROTEIN / CREATININE RATIO, URINE     Status: Abnormal   Collection Time    04/20/14  3:00 PM      Result Value Ref Range   Creatinine, Urine 58.50     Total Protein, Urine 11.7     Comment: NO NORMAL RANGE ESTABLISHED FOR THIS TEST   Protein Creatinine Ratio 0.20 (*) 0.00 - 0.15  URINALYSIS, ROUTINE W REFLEX MICROSCOPIC     Status: Abnormal   Collection Time    04/20/14  3:20 PM      Result Value Ref Range   Color, Urine YELLOW  YELLOW   APPearance CLEAR  CLEAR   Specific Gravity, Urine <1.005 (*) 1.005 - 1.030   pH 6.0  5.0 - 8.0   Glucose, UA NEGATIVE  NEGATIVE mg/dL   Hgb urine dipstick TRACE (*) NEGATIVE   Bilirubin Urine NEGATIVE  NEGATIVE   Ketones, ur NEGATIVE  NEGATIVE mg/dL   Protein, ur NEGATIVE  NEGATIVE mg/dL   Urobilinogen, UA 0.2  0.0 - 1.0 mg/dL   Nitrite NEGATIVE  NEGATIVE   Leukocytes, UA NEGATIVE  NEGATIVE  URINE MICROSCOPIC-ADD ON     Status: None   Collection Time    04/20/14  3:20 PM      Result Value Ref Range   Squamous Epithelial / LPF RARE  RARE   WBC, UA 0-2  <3 WBC/hpf   RBC / HPF 0-2  <3 RBC/hpf  CBC     Status: Abnormal   Collection Time    04/20/14  4:20 PM      Result Value Ref Range   WBC 11.4 (*) 4.0 - 10.5 K/uL   RBC 4.57  3.87 - 5.11 MIL/uL   Hemoglobin 13.2  12.0 - 15.0 g/dL   HCT 38.6  36.0 - 46.0 %   MCV 84.5  78.0 - 100.0 fL   MCH 28.9  26.0 - 34.0 pg   MCHC 34.2  30.0 - 36.0 g/dL   RDW 14.6  11.5 - 15.5 %   Platelets 318  150 - 400 K/uL  COMPREHENSIVE METABOLIC PANEL     Status: Abnormal   Collection Time    04/20/14  4:20 PM      Result Value Ref Range   Sodium 135 (*) 137 - 147 mEq/L   Potassium 3.8  3.7 - 5.3 mEq/L   Chloride 101  96 - 112 mEq/L   CO2 20  19 - 32 mEq/L   Glucose, Bld 83  70 - 99  mg/dL   BUN 6  6 - 23 mg/dL   Creatinine, Ser 0.62  0.50 - 1.10 mg/dL   Calcium 8.9  8.4 - 10.5 mg/dL    Total Protein 6.3  6.0 - 8.3 g/dL   Albumin 2.6 (*) 3.5 - 5.2 g/dL   AST 13  0 - 37 U/L   ALT 12  0 - 35 U/L   Alkaline Phosphatase 151 (*) 39 - 117 U/L   Total Bilirubin <0.2 (*) 0.3 - 1.2 mg/dL   GFR calc non Af Amer >90  >90 mL/min   GFR calc Af Amer >90  >90 mL/min   Comment: (NOTE)     The eGFR has been calculated using the CKD EPI equation.     This calculation has not been validated in all clinical situations.     eGFR's persistently <90 mL/min signify possible Chronic Kidney     Disease.   Anion gap 14  5 - 15     Assessment and Plan  A:  SIUP at [redacted]w[redacted]d      Hypertension in pregnancy, normotensive now      Normal labs  P:  Discussed with Dr AJulien Girt      Discharge home       [facility]autions       Follow up in office  [facility]10/11/2013, 3:37 PM

## 2014-04-22 ENCOUNTER — Encounter (HOSPITAL_COMMUNITY): Payer: Self-pay | Admitting: *Deleted

## 2014-04-22 ENCOUNTER — Telehealth (HOSPITAL_COMMUNITY): Payer: Self-pay | Admitting: *Deleted

## 2014-04-22 NOTE — Telephone Encounter (Signed)
Preadmission screen  

## 2014-04-25 ENCOUNTER — Encounter (HOSPITAL_COMMUNITY): Payer: Self-pay | Admitting: *Deleted

## 2014-04-25 ENCOUNTER — Inpatient Hospital Stay (HOSPITAL_COMMUNITY)
Admission: AD | Admit: 2014-04-25 | Discharge: 2014-04-28 | DRG: 775 | Disposition: A | Discharge status: Home / Self Care | Payer: BC Managed Care – PPO | Source: Ambulatory Visit | Attending: Obstetrics and Gynecology | Admitting: Obstetrics and Gynecology

## 2014-04-25 DIAGNOSIS — Z3A38 38 weeks gestation of pregnancy: Secondary | ICD-10-CM | POA: Diagnosis present

## 2014-04-25 DIAGNOSIS — E559 Vitamin D deficiency, unspecified: Secondary | ICD-10-CM | POA: Diagnosis present

## 2014-04-25 DIAGNOSIS — O99214 Obesity complicating childbirth: Secondary | ICD-10-CM | POA: Diagnosis present

## 2014-04-25 DIAGNOSIS — Z87891 Personal history of nicotine dependence: Secondary | ICD-10-CM

## 2014-04-25 DIAGNOSIS — F418 Other specified anxiety disorders: Secondary | ICD-10-CM | POA: Diagnosis present

## 2014-04-25 DIAGNOSIS — Z833 Family history of diabetes mellitus: Secondary | ICD-10-CM | POA: Diagnosis not present

## 2014-04-25 DIAGNOSIS — K219 Gastro-esophageal reflux disease without esophagitis: Secondary | ICD-10-CM | POA: Diagnosis present

## 2014-04-25 DIAGNOSIS — O99824 Streptococcus B carrier state complicating childbirth: Secondary | ICD-10-CM | POA: Diagnosis present

## 2014-04-25 DIAGNOSIS — O99343 Other mental disorders complicating pregnancy, third trimester: Secondary | ICD-10-CM | POA: Diagnosis present

## 2014-04-25 DIAGNOSIS — O99613 Diseases of the digestive system complicating pregnancy, third trimester: Secondary | ICD-10-CM | POA: Diagnosis present

## 2014-04-25 DIAGNOSIS — O99284 Endocrine, nutritional and metabolic diseases complicating childbirth: Secondary | ICD-10-CM | POA: Diagnosis present

## 2014-04-25 DIAGNOSIS — K589 Irritable bowel syndrome without diarrhea: Secondary | ICD-10-CM | POA: Diagnosis present

## 2014-04-25 DIAGNOSIS — O4292 Full-term premature rupture of membranes, unspecified as to length of time between rupture and onset of labor: Secondary | ICD-10-CM | POA: Diagnosis present

## 2014-04-25 DIAGNOSIS — Z6838 Body mass index (BMI) 38.0-38.9, adult: Secondary | ICD-10-CM | POA: Diagnosis not present

## 2014-04-25 DIAGNOSIS — Z8249 Family history of ischemic heart disease and other diseases of the circulatory system: Secondary | ICD-10-CM | POA: Diagnosis not present

## 2014-04-25 DIAGNOSIS — E039 Hypothyroidism, unspecified: Secondary | ICD-10-CM | POA: Diagnosis present

## 2014-04-25 HISTORY — DX: Personal history of other diseases of the nervous system and sense organs: Z86.69

## 2014-04-25 LAB — CBC
HCT: 40.6 % (ref 36.0–46.0)
Hemoglobin: 13.8 g/dL (ref 12.0–15.0)
MCH: 28.9 pg (ref 26.0–34.0)
MCHC: 34 g/dL (ref 30.0–36.0)
MCV: 84.9 fL (ref 78.0–100.0)
PLATELETS: 350 10*3/uL (ref 150–400)
RBC: 4.78 MIL/uL (ref 3.87–5.11)
RDW: 14.5 % (ref 11.5–15.5)
WBC: 10.9 10*3/uL — ABNORMAL HIGH (ref 4.0–10.5)

## 2014-04-25 LAB — POCT FERN TEST: POCT Fern Test: POSITIVE

## 2014-04-25 MED ORDER — DEXTROSE 5 % IV SOLN
2.5000 10*6.[IU] | INTRAVENOUS | Status: DC
  Administered 2014-04-26 (×3): 2.5 10*6.[IU] via INTRAVENOUS
  Filled 2014-04-25 (×8): qty 2.5

## 2014-04-25 MED ORDER — PHENYLEPHRINE 40 MCG/ML (10ML) SYRINGE FOR IV PUSH (FOR BLOOD PRESSURE SUPPORT)
80.0000 ug | PREFILLED_SYRINGE | INTRAVENOUS | Status: DC | PRN
  Filled 2014-04-25: qty 2
  Filled 2014-04-25: qty 10

## 2014-04-25 MED ORDER — BUTORPHANOL TARTRATE 1 MG/ML IJ SOLN
1.0000 mg | INTRAMUSCULAR | Status: DC | PRN
  Administered 2014-04-26: 1 mg via INTRAVENOUS
  Filled 2014-04-25: qty 1

## 2014-04-25 MED ORDER — OXYTOCIN BOLUS FROM INFUSION
500.0000 mL | INTRAVENOUS | Status: DC
  Administered 2014-04-26: 500 mL via INTRAVENOUS

## 2014-04-25 MED ORDER — LACTATED RINGERS IV SOLN: 500.0000 mL | Freq: Once | INTRAVENOUS | Status: DC

## 2014-04-25 MED ORDER — LIDOCAINE HCL (PF) 1 % IJ SOLN
30.0000 mL | INTRAMUSCULAR | Status: DC | PRN
  Filled 2014-04-25: qty 30

## 2014-04-25 MED ORDER — PHENYLEPHRINE 40 MCG/ML (10ML) SYRINGE FOR IV PUSH (FOR BLOOD PRESSURE SUPPORT)
80.0000 ug | PREFILLED_SYRINGE | INTRAVENOUS | Status: DC | PRN
  Filled 2014-04-25: qty 2

## 2014-04-25 MED ORDER — CITRIC ACID-SODIUM CITRATE 334-500 MG/5ML PO SOLN
30.0000 mL | ORAL | Status: DC | PRN
  Administered 2014-04-26: 30 mL via ORAL
  Filled 2014-04-25: qty 15

## 2014-04-25 MED ORDER — OXYCODONE-ACETAMINOPHEN 5-325 MG PO TABS: 1.0000 | ORAL_TABLET | ORAL | Status: DC | PRN

## 2014-04-25 MED ORDER — EPHEDRINE 5 MG/ML INJ
10.0000 mg | INTRAVENOUS | Status: DC | PRN
  Filled 2014-04-25: qty 2

## 2014-04-25 MED ORDER — OXYTOCIN 40 UNITS IN LACTATED RINGERS INFUSION - SIMPLE MED: 62.5000 mL/h | INTRAVENOUS | Status: DC

## 2014-04-25 MED ORDER — CEFUROXIME AXETIL 500 MG PO TABS
500.0000 mg | ORAL_TABLET | Freq: Two times a day (BID) | ORAL | Status: DC
  Administered 2014-04-25: 500 mg via ORAL
  Filled 2014-04-25 (×4): qty 1

## 2014-04-25 MED ORDER — ACETAMINOPHEN 325 MG PO TABS
650.0000 mg | ORAL_TABLET | ORAL | Status: DC | PRN
Start: 2014-04-25 — End: 2014-04-26
  Administered 2014-04-25: 650 mg via ORAL
  Filled 2014-04-25 (×2): qty 2

## 2014-04-25 MED ORDER — DIPHENHYDRAMINE HCL 50 MG/ML IJ SOLN: 12.5000 mg | INTRAMUSCULAR | Status: DC | PRN

## 2014-04-25 MED ORDER — PANTOPRAZOLE SODIUM 40 MG PO TBEC
40.0000 mg | DELAYED_RELEASE_TABLET | Freq: Every day | ORAL | Status: DC
  Administered 2014-04-25: 40 mg via ORAL
  Filled 2014-04-25: qty 1

## 2014-04-25 MED ORDER — LACTATED RINGERS IV SOLN
INTRAVENOUS | Status: DC
  Administered 2014-04-25: 22:00:00 via INTRAVENOUS

## 2014-04-25 MED ORDER — OXYCODONE-ACETAMINOPHEN 5-325 MG PO TABS: 2.0000 | ORAL_TABLET | ORAL | Status: DC | PRN

## 2014-04-25 MED ORDER — LACTATED RINGERS IV SOLN: 500.0000 mL | INTRAVENOUS | Status: DC | PRN

## 2014-04-25 MED ORDER — ONDANSETRON HCL 4 MG/2ML IJ SOLN
4.0000 mg | Freq: Four times a day (QID) | INTRAMUSCULAR | Status: DC | PRN
  Administered 2014-04-26: 4 mg via INTRAVENOUS
  Filled 2014-04-25: qty 2

## 2014-04-25 MED ORDER — PENICILLIN G POTASSIUM 5000000 UNITS IJ SOLR
5.0000 10*6.[IU] | Freq: Once | INTRAVENOUS | Status: AC
  Administered 2014-04-25: 5 10*6.[IU] via INTRAVENOUS
  Filled 2014-04-25: qty 5

## 2014-04-25 MED ORDER — FENTANYL 2.5 MCG/ML BUPIVACAINE 1/10 % EPIDURAL INFUSION (WH - ANES)
14.0000 mL/h | INTRAMUSCULAR | Status: DC | PRN
  Administered 2014-04-26 (×2): 14 mL/h via EPIDURAL
  Filled 2014-04-25 (×2): qty 125

## 2014-04-25 NOTE — MAU Note (Signed)
Pt reports water broken at 1645 this afternoon, clear fluid.  No VB, pelvic pressure.

## 2014-04-26 ENCOUNTER — Encounter (HOSPITAL_COMMUNITY): Payer: BC Managed Care – PPO | Admitting: Anesthesiology

## 2014-04-26 ENCOUNTER — Inpatient Hospital Stay (HOSPITAL_COMMUNITY): Payer: BC Managed Care – PPO | Admitting: Anesthesiology

## 2014-04-26 ENCOUNTER — Encounter (HOSPITAL_COMMUNITY): Payer: Self-pay | Admitting: *Deleted

## 2014-04-26 LAB — CBC WITH DIFFERENTIAL/PLATELET
Basophils Absolute: 0 10*3/uL (ref 0.0–0.1)
Basophils Relative: 0 % (ref 0–1)
Eosinophils Absolute: 0.1 10*3/uL (ref 0.0–0.7)
Eosinophils Relative: 1 % (ref 0–5)
HCT: 37.7 % (ref 36.0–46.0)
HEMOGLOBIN: 12.6 g/dL (ref 12.0–15.0)
LYMPHS ABS: 2.3 10*3/uL (ref 0.7–4.0)
Lymphocytes Relative: 21 % (ref 12–46)
MCH: 28.5 pg (ref 26.0–34.0)
MCHC: 33.4 g/dL (ref 30.0–36.0)
MCV: 85.3 fL (ref 78.0–100.0)
MONO ABS: 0.6 10*3/uL (ref 0.1–1.0)
MONOS PCT: 6 % (ref 3–12)
NEUTROS ABS: 8 10*3/uL — AB (ref 1.7–7.7)
Neutrophils Relative %: 72 % (ref 43–77)
Platelets: 304 10*3/uL (ref 150–400)
RBC: 4.42 MIL/uL (ref 3.87–5.11)
RDW: 14.6 % (ref 11.5–15.5)
WBC: 11 10*3/uL — ABNORMAL HIGH (ref 4.0–10.5)

## 2014-04-26 LAB — RPR

## 2014-04-26 MED ORDER — PRENATAL MULTIVITAMIN CH
1.0000 | ORAL_TABLET | Freq: Every day | ORAL | Status: DC
  Administered 2014-04-28: 1 via ORAL
  Filled 2014-04-26 (×2): qty 1

## 2014-04-26 MED ORDER — OXYCODONE-ACETAMINOPHEN 5-325 MG PO TABS
2.0000 | ORAL_TABLET | ORAL | Status: DC | PRN
  Administered 2014-04-28: 2 via ORAL

## 2014-04-26 MED ORDER — SIMETHICONE 80 MG PO CHEW: 80.0000 mg | CHEWABLE_TABLET | ORAL | Status: DC | PRN

## 2014-04-26 MED ORDER — BENZOCAINE-MENTHOL 20-0.5 % EX AERO
1.0000 "application " | INHALATION_SPRAY | CUTANEOUS | Status: DC | PRN
  Administered 2014-04-28: 1 via TOPICAL
  Filled 2014-04-26 (×2): qty 56

## 2014-04-26 MED ORDER — LIDOCAINE HCL (PF) 1 % IJ SOLN
INTRAMUSCULAR | Status: DC | PRN
  Administered 2014-04-26: 3 mL
  Administered 2014-04-26 (×2): 5 mL

## 2014-04-26 MED ORDER — WITCH HAZEL-GLYCERIN EX PADS: 1.0000 "application " | MEDICATED_PAD | CUTANEOUS | Status: DC | PRN

## 2014-04-26 MED ORDER — OXYTOCIN 40 UNITS IN LACTATED RINGERS INFUSION - SIMPLE MED
1.0000 m[IU]/min | INTRAVENOUS | Status: DC
  Administered 2014-04-26: 1 m[IU]/min via INTRAVENOUS
  Filled 2014-04-26: qty 1000

## 2014-04-26 MED ORDER — PANTOPRAZOLE SODIUM 40 MG PO TBEC
40.0000 mg | DELAYED_RELEASE_TABLET | Freq: Every day | ORAL | Status: DC
  Administered 2014-04-26 – 2014-04-27 (×2): 40 mg via ORAL
  Filled 2014-04-26 (×2): qty 1

## 2014-04-26 MED ORDER — CEFUROXIME AXETIL 500 MG PO TABS
500.0000 mg | ORAL_TABLET | Freq: Two times a day (BID) | ORAL | Status: DC
  Administered 2014-04-26 – 2014-04-28 (×4): 500 mg via ORAL
  Filled 2014-04-26 (×7): qty 1

## 2014-04-26 MED ORDER — DOCUSATE SODIUM 100 MG PO CAPS: 300.0000 mg | ORAL_CAPSULE | Freq: Every day | ORAL | Status: DC | PRN

## 2014-04-26 MED ORDER — SENNOSIDES-DOCUSATE SODIUM 8.6-50 MG PO TABS
2.0000 | ORAL_TABLET | ORAL | Status: DC
  Administered 2014-04-27: 2 via ORAL
  Filled 2014-04-26: qty 2

## 2014-04-26 MED ORDER — LANOLIN HYDROUS EX OINT: TOPICAL_OINTMENT | CUTANEOUS | Status: DC | PRN

## 2014-04-26 MED ORDER — IBUPROFEN 600 MG PO TABS
600.0000 mg | ORAL_TABLET | Freq: Four times a day (QID) | ORAL | Status: DC
  Administered 2014-04-26 – 2014-04-28 (×7): 600 mg via ORAL
  Filled 2014-04-26 (×8): qty 1

## 2014-04-26 MED ORDER — FENTANYL 2.5 MCG/ML BUPIVACAINE 1/10 % EPIDURAL INFUSION (WH - ANES)
INTRAMUSCULAR | Status: DC | PRN
  Administered 2014-04-26: 14 mL/h via EPIDURAL

## 2014-04-26 MED ORDER — ALBUTEROL SULFATE (2.5 MG/3ML) 0.083% IN NEBU: 2.5000 mg | INHALATION_SOLUTION | Freq: Four times a day (QID) | RESPIRATORY_TRACT | Status: DC | PRN

## 2014-04-26 MED ORDER — DIBUCAINE 1 % RE OINT: 1.0000 "application " | TOPICAL_OINTMENT | RECTAL | Status: DC | PRN

## 2014-04-26 MED ORDER — OXYTOCIN 40 UNITS IN LACTATED RINGERS INFUSION - SIMPLE MED: 1.0000 m[IU]/min | INTRAVENOUS | Status: DC

## 2014-04-26 MED ORDER — MEDROXYPROGESTERONE ACETATE 150 MG/ML IM SUSP: 150.0000 mg | INTRAMUSCULAR | Status: DC | PRN

## 2014-04-26 MED ORDER — ONDANSETRON HCL 4 MG/2ML IJ SOLN: 4.0000 mg | INTRAMUSCULAR | Status: DC | PRN

## 2014-04-26 MED ORDER — TERBUTALINE SULFATE 1 MG/ML IJ SOLN: 0.2500 mg | Freq: Once | INTRAMUSCULAR | Status: DC | PRN

## 2014-04-26 MED ORDER — ONDANSETRON HCL 4 MG PO TABS: 4.0000 mg | ORAL_TABLET | ORAL | Status: DC | PRN

## 2014-04-26 MED ORDER — TETANUS-DIPHTH-ACELL PERTUSSIS 5-2.5-18.5 LF-MCG/0.5 IM SUSP: 0.5000 mL | Freq: Once | INTRAMUSCULAR | Status: DC

## 2014-04-26 MED ORDER — OXYCODONE-ACETAMINOPHEN 5-325 MG PO TABS
1.0000 | ORAL_TABLET | ORAL | Status: DC | PRN
  Administered 2014-04-27: 1 via ORAL
  Filled 2014-04-26 (×3): qty 1

## 2014-04-26 MED ORDER — DIPHENHYDRAMINE HCL 25 MG PO CAPS: 25.0000 mg | ORAL_CAPSULE | Freq: Four times a day (QID) | ORAL | Status: DC | PRN

## 2014-04-26 MED ORDER — MEASLES, MUMPS & RUBELLA VAC ~~LOC~~ INJ
0.5000 mL | INJECTION | Freq: Once | SUBCUTANEOUS | Status: DC
  Filled 2014-04-26: qty 0.5

## 2014-04-26 NOTE — Progress Notes (Signed)
SVD of vigerous female infant w/ apgars of 9,9.  Placenta delivered spontaneous w/ 3VC.   1st degree& periurethral lac repaired w/ 3-0 vicryl rapide.  Fundus firm.  EBL 400cc .

## 2014-04-26 NOTE — Plan of Care (Signed)
Problem: Phase I Progression Outcomes Goal: OOB as tolerated unless otherwise ordered Outcome: Completed/Met Date Met:  04/26/14 Pt on bedrest due to epidural, explained fall risk and safety measures, pt demonstrates understanding

## 2014-04-26 NOTE — Progress Notes (Signed)
Pt c/o pressure & pushing w/ good effort  FHT reassuring Toco Q2 Cvx c/c/+2  A/P:  Will continue pushing Exp mngt

## 2014-04-26 NOTE — Anesthesia Preprocedure Evaluation (Addendum)
Anesthesia Evaluation  Patient identified by MRN, date of birth, ID band Patient awake    Reviewed: Allergy & Precautions, H&P , NPO status , Patient's Chart, lab work & pertinent test results  Airway Mallampati: II TM Distance: >3 FB Neck ROM: Full    Dental no notable dental hx.    Pulmonary asthma , former smoker,  breath sounds clear to auscultation  Pulmonary exam normal       Cardiovascular hypertension, Rhythm:Regular Rate:Normal     Neuro/Psych  Headaches, Anxiety Depression    GI/Hepatic negative GI ROS, Neg liver ROS,   Endo/Other  diabetesMorbid obesity  Renal/GU negative Renal ROS  negative genitourinary   Musculoskeletal  (+) Fibromyalgia -SLE   Abdominal   Peds negative pediatric ROS (+)  Hematology Warm antibody hemolytic anemia   Anesthesia Other Findings   Reproductive/Obstetrics negative OB ROS (+) Pregnancy                          Anesthesia Physical Anesthesia Plan  ASA: III  Anesthesia Plan: Epidural   Post-op Pain Management:    Induction:   Airway Management Planned:   Additional Equipment:   Intra-op Plan:   Post-operative Plan:   Informed Consent: I have reviewed the patients History and Physical, chart, labs and discussed the procedure including the risks, benefits and alternatives for the proposed anesthesia with the patient or authorized representative who has indicated his/her understanding and acceptance.   Dental advisory given  Plan Discussed with: CRNA  Anesthesia Plan Comments:         Anesthesia Quick Evaluation

## 2014-04-26 NOTE — Anesthesia Postprocedure Evaluation (Signed)
Anesthesia Post Note  Patient: Dance movement psychotherapistLyndsay [name]  Procedure(s) Performed: * No procedures listed *  Anesthesia type: Epidural  Patient location: Mother/Baby  Post pain: Pain level controlled  Post assessment: Post-op Vital signs reviewed  Last Vitals:  Filed Vitals:   04/26/14 1530  BP: 135/73  Pulse: 99  Temp: 37 C  Resp: 18    Post vital signs: Reviewed  Level of consciousness:alert  Complications: No apparent anesthesia complications

## 2014-04-26 NOTE — Anesthesia Procedure Notes (Signed)
Epidural Patient location during procedure: OB  Staffing Anesthesiologist: Ryin Schillo Performed by: anesthesiologist   Preanesthetic Checklist Completed: patient identified, site marked, surgical consent, pre-op evaluation, timeout performed, IV checked, risks and benefits discussed and monitors and equipment checked  Epidural Patient position: sitting Prep: ChloraPrep Patient monitoring: heart rate, continuous pulse ox and blood pressure Approach: right paramedian Location: L3-L4 Injection technique: LOR saline  Needle:  Needle type: Tuohy  Needle gauge: 17 G Needle length: 9 cm and 9 Needle insertion depth: 7 cm Catheter type: closed end flexible Catheter size: 20 Guage Catheter at skin depth: 11 cm Test dose: negative  Assessment Events: blood not aspirated, injection not painful, no injection resistance, negative IV test and no paresthesia  Additional Notes   Patient tolerated the insertion well without complications.   

## 2014-04-26 NOTE — Lactation Note (Signed)
This note was copied from the chart of Girl Bennetta Stordahl. Lactation Consultation Note  Patient Name: Girl Faustino CongressLyndsay Turrell GNFAO'ZToday's Date: 04/26/2014 Reason for consult: Initial assessment Baby 6 hours of life. Patient's nurse requested LC visit. Patient's MBU nurse Bo MerinoMartha S able to latch baby for mom, but mom still wanting LC. Attempted to assist mom to latch baby directly to breast. Baby trying to latch and would latch for a few seconds but would not sustain latch. LC placed gloved finger in baby's mouth to assess suck. Baby chomping [facility] gloved finger. Mom concerned that she needs shells and a NS. Discussed with mom that more important right now to focus on STS, not unusual for baby's to latch well right after birth then be sleepy and uninterested for a few hours during transition time. Fitted mom with a #16 NS, but baby would not latch and suckle. Mom given breast shells and hand pump with instructions. Mom having difficulty keeping eyes open and communicating with LC. Moved crib between [name] and GMa and discouraged mom from sleeping with baby in bed. Enc GMa to removed baby from [name]'s bed if mom falling asleep. Enc mom to offer STS and nurse with cues. Mom has colostrum and enc mom to continue to baby drops as demonstrated by [facility]. Mom return-demonstrated hand expression with plenty of colostrum available. Mom given [facility] brochure, aware of OP/BFSG, community resources and [facility] phone line services. Enc mom to ask for assistance with latching as needed. Enc mom to perform additional stimulation with hand pump if using NS. Discussed goal of nursing directly at breast and only using NS as a temporary measure. Discussed need to watch baby's weight especially while using NS. Discussed with mom that BF recommendations from her nurses and techs and other LCs may change with age and presentation of baby. Enc mom to respond to baby, and baby's individual needs and behaviors.  Maternal Data Has patient been taught  Hand Expression?: Yes Does the patient have breastfeeding experience prior to this delivery?: No  Feeding Feeding Type: Breast Fed Length of feed: 0 min  LATCH Score/Interventions Latch: Too sleepy or reluctant, no latch achieved, no sucking elicited. Intervention(s): Skin to skin;Teach feeding cues;Waking techniques Intervention(s): Adjust position;Assist with latch;Breast massage;Breast compression  Audible Swallowing: None Intervention(s): Hand expression  Type of Nipple: Flat Intervention(s): Shells  Comfort (Breast/Nipple): Soft / non-tender     Hold (Positioning): Full assist, staff holds infant at breast Intervention(s): Support Pillows;Breastfeeding basics reviewed;Position options;Skin to skin  LATCH Score: 3  Lactation Tools Discussed/Used     Consult Status Consult Status: Follow-up Date: 04/27/14 Follow-up type: In-patient    Geralynn OchsWILLIARD, Cayne Yom 04/26/2014, 7:00 PM

## 2014-04-26 NOTE — H&P (Addendum)
[name] [name] is a 23 y.o. female G3P0 @ 38+ wks presenting for PROM.  Pitocin started overnight.  No vb or LOF.    History OB History   Grav Para Term Preterm Abortions TAB SAB Ect Mult Living   3 0   2  2        Past Medical History  Diagnosis Date  . Hypothyroidism   . Anxiety   . Vitamin D deficiency   . Depression   . 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 Fibromyalgia   . Lupus   . Warm antibody hemolytic anemia   . Diabetes mellitus without complication   . Hypertension   . GERD (gastroesophageal reflux disease)   . IBS (irritable bowel syndrome)   . Headache   . Fibromyalgia   . Hx of migraines    Past Surgical History  Procedure Laterality Date  . No past surgeries     Family History: family history includes Diabetes in her paternal grandfather; Fibromyalgia in her mother; Heart disease in her mother; Hypertension in her father and mother; Lupus in her mother. Social History:  reports that she quit smoking about 8 months ago. She has never used smokeless tobacco. She reports that she does not drink alcohol or use illicit drugs.   Prenatal Transfer Tool  Maternal Diabetes: No Genetic Screening: Normal Maternal Ultrasounds/Referrals: Normal Fetal Ultrasounds or other Referrals:  None Maternal Substance Abuse:  No Significant Maternal Medications:  Meds include: Syntroid Significant Maternal Lab Results:  Lab values include: Group B Strep positive, Other:  Ab pos Other Comments:  None  ROS  Dilation: 4 Effacement (%): 70 Station: -2 Exam by:: Foley Blood pressure 127/74, pulse 84, temperature 98.1 F (36.7 C), temperature source Oral, resp. rate 20, height 5\' 4"  (1.626 m), weight 225 lb (102.059 kg), last menstrual period 07/09/2013, SpO2 99.00%. Exam Physical Exam  Af, VSS FHT reassuring Toco difficult to trace Gen - NAD - now comfortable w/ epidural Abd - gravid, NT Ext - trace edema bilaterally Cvx 3cm on admit - now 4cm IUPC  placed  Prenatal labs: ABO, Rh: --/--/O POS (10/10 1945) Antibody: POS (10/10 1945) Rubella: Immune (03/11 0000) RPR: NON REAC (10/10 1850)  HBsAg: Negative (03/11 0000)  HIV: Non-reactive (03/11 0000)  GBS: Positive (09/21 0000)   Assessment/Plan: Admit Epidural prn PCN for GBS Exp mngt   [name] [name] 04/26/2014, 7:57 AM

## 2014-04-27 ENCOUNTER — Inpatient Hospital Stay (HOSPITAL_COMMUNITY): Admission: RE | Admit: 2014-04-27 | Payer: BC Managed Care – PPO | Source: Ambulatory Visit

## 2014-04-27 LAB — CBC
HCT: 33.9 % — ABNORMAL LOW (ref 36.0–46.0)
Hemoglobin: 11.2 g/dL — ABNORMAL LOW (ref 12.0–15.0)
MCH: 28.6 pg (ref 26.0–34.0)
MCHC: 33 g/dL (ref 30.0–36.0)
MCV: 86.5 fL (ref 78.0–100.0)
Platelets: 263 10*3/uL (ref 150–400)
RBC: 3.92 MIL/uL (ref 3.87–5.11)
RDW: 15.2 % (ref 11.5–15.5)
WBC: 11.6 10*3/uL — ABNORMAL HIGH (ref 4.0–10.5)

## 2014-04-27 MED ORDER — SERTRALINE HCL 25 MG PO TABS
25.0000 mg | ORAL_TABLET | Freq: Every day | ORAL | Status: DC
  Administered 2014-04-27 – 2014-04-28 (×2): 25 mg via ORAL
  Filled 2014-04-27 (×3): qty 1

## 2014-04-27 NOTE — Progress Notes (Signed)
Clinical Social Work Department PSYCHOSOCIAL ASSESSMENT - MATERNAL/CHILD 04/27/2014  Patient:  [name],[name]  Account Number:  [id number]  Admit Date:  04/25/2014  Childs Name:   [name] [name]   Clinical Social Worker:  Vinicius Brockman, CLINICAL SOCIAL WORKER   Date/Time:  04/27/2014 10:45 AM  Date Referred:  04/26/2014   Referral source  Central Nursery     Referred reason  Depression/Anxiety   Other referral source:    I:  FAMILY / HOME ENVIRONMENT Child's legal guardian:  PARENT  Guardian - Name Guardian - Age Guardian - Address  [name] [name] 23 [address] [place], [place] [address]  [name] [name]  same as above   Other household support members/support persons Other support:   [name] stated that her family and the FOB's family often cause her anxiety to worsen, but she did state that her mother was present overnight and helped her with the baby so that she could sleep.    II  PSYCHOSOCIAL DATA Information Source:  Family Interview  Financial and Community Resources Employment:   [name] stated she is currently not working. She stated that she is not working due to her physical health needs and her symptoms of anxiety.  Prental appointments document that the FOB is a network engineer. CSW did not confirm employer, but he did state that he works.   Financial resources:  Private Insurance If Medicaid - County:    School / Grade:  N/A Maternity Care Coordinator / Child Services Coordination / Early Interventions:   N/A  Cultural issues impacting care:   None reported.    III  STRENGTHS Strengths  Home prepared for Child (including basic supplies)  Adequate Resources   Strength comment:    IV  RISK FACTORS AND CURRENT PROBLEMS Current Problem:  YES   Risk Factor & Current Problem Patient Issue Family Issue Risk Factor / Current Problem Comment  Mental Illness Y N [name] presents with mental health history significant for depression and anxiety.  [name] reported high  levels of anxiety throughout her pregnancy due to previous miscarriages and due to receiving new medical diagnoses that she was unable to receive treatment for since she was pregnant.    V  SOCIAL WORK ASSESSMENT CSW met with the [name] in her room in order to complete the assessment. Consult was ordered due to [name] presenting with a history of anxiety and depression.  [name] was receptive to the visit and openly discussed her history of anxiety and depression.  She presented with limited self-awareness on how to self-regulate her mood and how to reduce her anxiety.  She verbalized numerous topics that continue to cause her to feel anxious as she transitions into the postpartum period, including her feelings related to visitors in the hospital and difficulties with breast feeding.  [name] expressed gratitude for the visit, and was agreeable to CSW recommendations.   [name] shared that she "feels good" so far when asked to reflect upon her current thoughts and feelings.  She stated that she has been concerned about postpartum depression and anxiety since she presents with a long history of anxiety and depression since being a teenager.  [name] stated that she was placed into foster care as a child, and that she has been told that she may have PTSD from this event. CSW did not warrant it necessary for her to discuss these events in great detail, as [name] presented with numerous current/recent events that are causing her anxiety.    [name] shared that she   has fibromyalgia and was then diagnosed with lupus during her pregnancy.  She discussed the stress that this new diagnosis caused, and continued to process her physical pains that she experienced during her pregnancy since she could not receive treatment for her medical needs.  [name] presented with self-awareness regarding the connection between her physical illnesses and her anxiety, as she discussed how she feels anxious since is uncomfortable, which then causes her heart to race  and her blood pressure to increase, which then leads to more physical discomfort given her needs. Per [name], "this past year has been the most difficult year of my life so far".  [name] identified her anxiety as the always present force, and shared that there are few activities that she can engage in where she does not feel anxiety.  She stated that she has "tried everything" to address her anxiety, including therapy and self-soothing activities, but denied the efficacy of these interventions . She shared that the FOB is able to help minimally since he is not able to provide the words of comfort that she needs.  [name] is also unable to identify what she does need in anxiety provoking moments.    [name] expressed relief since she is now able to see the baby and bond with her, but continues to share her anxiety related to difficulties with breast feeding.  She stated that she has read that "breast milk is best", and that she is feeling guilty/responsible that the baby is having difficulties latching due to her nipple shape.  [name] is able to disengage from need to breast feed if it is in the best interest of the baby's health to receive formula, but she stated that she is already anticipating blaming herself if she does not have to resort to formula.  She also expressed anxiety related to her visitors while at the hospital.  She shared that she does not want frequent visitors, but that she is receiving pressure from her family for them to come and visit.  [name] discussed that if she does not allow visitors, she will receive negative feedback from them, which will cause her anxiety to worsen.  As CSW continued to assist [name] process her anxiety, she acknowledged that there are pros/cons to each outcome, and in the end, she stated that she will do what is best for her and the baby that will also cause the least amount of harm.  At the end of the visit, the [name] and FOB were attempting to create a compromise regarding visitors that  would allow family to visit for only a brief period of time in order to allow the [name] time to rest and to receive assistance with latching.    CSW discussed concerns about [name] presenting with numerous risk factors for developing postpartum depression/anxiety given her recent/current anxiety.  [name] acknowledged the statement and expressed desire to "get rid of" her anxiety in an ideal situation.  She shared that she is receptive to medications if she is able to breast feed.  CSW recommended that the [name] contact her MD to see if they would be willing to prescribe medications for while she is at the hospital.  [name] denied belief that her anxiety may negatively impact her ability to be a mother, but she acknowledge that she would enjoy the transition into the postpartum period more if she were able to feel less anxious.    CSW spoke with the MD's office and left message requesting that a medication to address [name]'s   symptoms be considered.  Per RN at MD's office, message will be passed along to MD and they will address CSW's concerns with the [name].   CSW will follow-up with the [name] on 10/13.   VI SOCIAL WORK PLAN Social Work Plan  Patient/Family Education  Information/Referral to Community Resources  No Barriers to Discharge   Type of pt/family education:   Postpartum depression and anxiety   If child protective services report - county:   If child protective services report - date:   Information/referral to community resources comment:   CSW offered referral for therapy, but [name] shared belief that she does not believe medication will be helpful.  She stated that she believes medications may be the only solution at this point. CSW to provide referrals for medication management.    Other social work plan:   CSW to follow-up with [name] on 10/13.  CSW will follow-up with outcome of recommendation to MD to start [name] on a medication.  CSW will ensure that [name] has appropriate referrals for ongoing medication  management prior to discharge.       

## 2014-04-27 NOTE — Progress Notes (Signed)
Post Partum Day 1 Subjective: no complaints, up ad lib, voiding, tolerating PO and + flatus  Objective: Blood pressure 143/71, pulse 86, temperature 98.5 F (36.9 C), temperature source Oral, resp. rate 17, height 5\' 4"  (1.626 m), weight 225 lb (102.059 kg), last menstrual period 07/09/2013, SpO2 100.00%, unknown if currently breastfeeding.  Physical Exam:  General: alert and cooperative Lochia: appropriate Uterine Fundus: firm Incision: perineum intact DVT Evaluation: No evidence of DVT seen on physical exam. Negative Homan's sign. No cords or calf tenderness. No significant calf/ankle edema.   Recent Labs  04/26/14 0455 04/27/14 0605  HGB 12.6 11.2*  HCT 37.7 33.9*    Assessment/Plan: Plan for discharge tomorrow   LOS: 2 days   [name] [name] G 04/27/2014, 8:06 AM

## 2014-04-27 NOTE — Lactation Note (Signed)
This note was copied from the chart of [name] [name]. Lactation Consultation Note     Follow up consult with this mom and baby, full term, and 26 hours old. Mom has been having trouble latching the baby. On exam of baby's mouth, she has an upper lip frenulum that extends to her  gum line, and a tongue frenulum, posterior, that appears short, with herr tongue humping in the back of her mouth, and being pulled at the tip by her frenulum   I feels this is related to a difficult latch. The baby also ahs a recessed chin. i started mom puping with a DEP, and wih hand expression after, mom was able to collect close to 5 mls of colostrum. I also fitted mom with a 20 nipple shiled, and this was a good fit. The baby did not want to latch, but the shield did fit into her mouth. Mom with try and latch every 3 hours with the 20 shield, and then pump for 15 minutes in premie setting, followed by hand expression. Mom will then supplement, as needed, with EBM from previous feeding.   Patient Name: [name] Dashay Gutter UJWJX'BToday's DaFaustino Congresste: 04/27/2014 Reason for consult: Follow-up assessment   Maternal Data Formula Feeding for Exclusion: No Has patient been taught Hand Expression?: Yes Does the patient have breastfeeding experience prior to this delivery?: No  Feeding Feeding Type: Breast Milk  LATCH Score/Interventions Latch: Too sleepy or reluctant, no latch achieved, no sucking elicited. Intervention(s): Skin to skin Intervention(s): Adjust position;Assist with latch  Audible Swallowing: None Intervention(s): Hand expression  Type of Nipple: Flat (large nipple base, evert only with stmulation, soft tissue and breasts) Intervention(s): Double electric pump;Hand pump  Comfort (Breast/Nipple): Soft / non-tender     Hold (Positioning): Assistance needed to correctly position infant at breast and maintain latch. Intervention(s): Breastfeeding basics reviewed;Support Pillows;Position options;Skin to  skin  LATCH Score: 4  Lactation Tools Discussed/Used Tools: Nipple Dorris CarnesShields;Pump Nipple shield size: 16;20;24;Other (comment) (20 seems to be the abest fit) Breast pump type: Double-Electric Breast Pump Pump Review: Setup, frequency, and cleaning;Milk Storage;Other (comment) (premie setting, hand expression) Initiated by:: clee rn Date initiated:: 04/27/14   Consult Status Consult Status: Follow-up Date: 04/28/14 Follow-up type: In-patient    Alfred LevinsLee, Aniqua Briere Anne 04/27/2014, 3:43 PM

## 2014-04-28 MED ORDER — IBUPROFEN 600 MG PO TABS: 600.0000 mg | ORAL_TABLET | Freq: Four times a day (QID) | ORAL | Status: DC

## 2014-04-28 MED ORDER — SERTRALINE HCL 25 MG PO TABS: 25.0000 mg | ORAL_TABLET | Freq: Every day | ORAL | Status: DC

## 2014-04-28 MED ORDER — OXYCODONE-ACETAMINOPHEN 5-325 MG PO TABS: 1.0000 | ORAL_TABLET | ORAL | Status: DC | PRN

## 2014-04-28 NOTE — Progress Notes (Signed)
CSW briefly followed-up with the [name] prior to discharge.  [name] thanked CSW for assistance as she was prescribed Zoloft.  She shared that she feels relief knowing that she will be receiving the medication as she continues to feel overwhelmed, tired, and is very concerned about postpartum depression.  As [name] was expressing her thoughts, she smiled and denied need for further follow-up/inteventions from CSW.   No barriers to discharge.  

## 2014-04-28 NOTE — Lactation Note (Signed)
This note was copied from the chart of [name] [name]. Lactation Consultation Note  Patient Name: [name] Faustino CongressLyndsay Davisson WUJWJ'XToday's Date: 04/28/2014 Reason for consult: Follow-up assessment 2nd visit for to day, Earlier mom was so tired , LC revisited to review engorgement prevention and tx. For now per mom plans to pump and bottle feed/. Stressed the importance of consistent pumping  To establish and protect milk supply . Mom aware to switch over to all breast milk when volume increases. Mother informed of post-discharge support and given phone number to the lactation department, including services  for phone call assistance; out-patient appointments; and breastfeeding support group. List of other breastfeeding  resources in the community given in the handout. Encouraged mother to call for problems or concerns related to breastfeeding. Mom  And dad rented a DEBP , insurance pump.    Maternal Data    Feeding Feeding Type: Formula Nipple Type: Slow - flow  LATCH Score/Interventions                Intervention(s): Breastfeeding basics reviewed     Lactation Tools Discussed/Used WIC Program: No   Consult Status Consult Status: Complete Date: 04/28/14 Follow-up type: In-patient    Kathrin Greathouseorio, Elohim Brune Ann 04/28/2014, 12:24 PM

## 2014-04-28 NOTE — Discharge Summary (Signed)
Obstetric Discharge Summary Reason for Admission: rupture of membranes Prenatal Procedures: ultrasound Intrapartum Procedures: spontaneous vaginal delivery Postpartum Procedures: none Complications-Operative and Postpartum: 1 degree perineal laceration Hemoglobin  Date Value Ref Range Status  04/27/2014 11.2* 12.0 - 15.0 g/dL Final     HCT  Date Value Ref Range Status  04/27/2014 33.9* 36.0 - 46.0 % Final    Physical Exam:  General: alert and cooperative Lochia: appropriate Uterine Fundus: firm Incision: perineum intact DVT Evaluation: No evidence of DVT seen on physical exam. Negative Homan's sign. No cords or calf tenderness.  Discharge Diagnoses: Term Pregnancy-delivered  Discharge Information: Date: 04/28/2014 Activity: pelvic rest Diet: routine Medications: PNV, Ibuprofen and Percocet Condition: stable Instructions: refer to practice specific booklet Discharge to: home   Newborn Data: Live born female  Birth Weight: 6 lb 5.1 oz (2865 g) APGAR: 9, 9  Home with mother.  CURTIS,CAROL G 04/28/2014, 8:02 AM

## 2014-04-28 NOTE — Lactation Note (Signed)
This note was copied from the chart of [name] [name]. Lactation Consultation Note  Patient Name: [name] Faustino CongressLyndsay Manganelli ZOXWR'UToday's Date: 04/28/2014 Reason for consult: Follow-up assessment;Other (Comment) (supplement per Dr. Margo AyeHall , moms choice to pump and bottle ) Due to flat tissue , and using the Nipple shield , mom decided to pump and bottle fed for now.  Dr. Margo AyeHall recommended mom supplement with formula , due to no EBM being available. LC assisted with feeding with a bottle and noted the baby. To hug the bottle nipple. Difficult to assess frenulum due to baby crying , did notice a tight labia frenulum. Although labia frenulum stretched with the bottle nipple.  Mom very exhausted , and will have to follow up.    Maternal Data    Feeding Feeding Type: Formula Nipple Type: Slow - flow  LATCH Score/Interventions                Intervention(s): Breastfeeding basics reviewed     Lactation Tools Discussed/Used WIC Program: No   Consult Status Consult Status: Follow-up Date: 04/28/14 Follow-up type: In-patient    Kathrin Greathouseorio, Lokelani Lutes Ann 04/28/2014, 10:08 AM

## 2014-04-30 LAB — TYPE AND SCREEN
ABO/RH(D): O POS
Antibody Screen: POSITIVE
DAT, IGG: POSITIVE
PT AG TYPE: POSITIVE

## 2014-05-01 ENCOUNTER — Telehealth: Payer: Self-pay | Admitting: Nurse Practitioner

## 2014-05-04 MED ORDER — BREAST PUMP MISC: 1.0000 | Status: DC | PRN

## 2014-05-04 NOTE — Telephone Encounter (Signed)
Child is [name] [name] and she was seen on 04/30/14. Script printed and is upfront for pickup. Patient aware.

## 2014-05-04 NOTE — Telephone Encounter (Signed)
NTBS.

## 2014-05-10 NOTE — Progress Notes (Signed)
Subjective:    Patient ID: [name] [name], female  [name] [name]  DOB: 1990-08-03, 23 y.o.   MRN: [id number]  HPI Sharnise is a 23yo F in her 3rd trimester of pregnancy who sustained tick bite to left abdominal wall roughly 10-14 days ago. She states that originally only slight red surrounding erythema to insect bite that was slow to improve. She noticed that over the last week it has become increasingly larger in addition to mild central clearing. Her ob sent her to our clinic for evaluation. She denies having fevers, but has occ chills. She is anxious about her delivery.  She has not received her flu shot as of yet  Current Outpatient Prescriptions on File Prior to Visit  Medication Sig Dispense Refill  . acetaminophen (TYLENOL) 325 MG tablet Take 650 mg by mouth every 6 (six) hours as needed for mild pain or headache.        No current facility-administered medications on file prior to visit.   Active Ambulatory Problems    Diagnosis Date Noted  . Unspecified vitamin D deficiency 07/14/2013  . Hypothyroid 07/14/2013  . Anxiety   . Vitamin D deficiency   . Depression   . Weight gain 08/15/2013  . 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 Indication for care in labor or delivery 04/25/2014  . SVD (spontaneous vaginal delivery) 04/26/2014   Resolved Ambulatory Problems    Diagnosis Date Noted  . No Resolved Ambulatory Problems   Past Medical History  Diagnosis Date  . Hypothyroidism   . Fibromyalgia   . Lupus   . Warm antibody hemolytic anemia   . Diabetes mellitus without complication   . Hypertension   . GERD (gastroesophageal reflux disease)   . IBS (irritable bowel syndrome)   . Headache   . Fibromyalgia   . Hx of migraines    History  Substance Use Topics  . Smoking status: Former Smoker    Quit date: 07/29/2013  . Smokeless tobacco: Never Used  . Alcohol Use: No     Comment: occ   Family History  Problem Relation Age of Onset  . Fibromyalgia Mother   . Heart disease  Mother   . Hypertension Mother   . Lupus Mother   . Hypertension Father   . Diabetes Paternal Grandfather      Review of Systems Review of Systems  Constitutional: Negative for fever, chills, diaphoresis, activity change, appetite change, fatigue and unexpected weight change.  HENT: Negative for congestion, sore throat, rhinorrhea, sneezing, trouble swallowing and sinus pressure.  Eyes: Negative for photophobia and visual disturbance.  Respiratory: Negative for cough, chest tightness, shortness of breath, wheezing and stridor.  Cardiovascular: Negative for chest pain, palpitations and leg swelling.  Gastrointestinal: Negative for nausea, vomiting, abdominal pain, diarrhea, constipation, blood in stool, abdominal distention and anal bleeding.  Genitourinary: Negative for dysuria, hematuria, flank pain and difficulty urinating.  Musculoskeletal: Negative for myalgias, back pain, joint swelling, arthralgias and gait problem.  Skin: + rash Neurological: Negative for dizziness, tremors, weakness and light-headedness.  Hematological: Negative for adenopathy. Does not bruise/bleed easily.  Psychiatric/Behavioral: Negative for behavioral problems, confusion, sleep disturbance, dysphoric mood, decreased concentration and agitation.       Objective:   Physical Exam BP 130/85  Pulse 87  Temp(Src) 98 F (36.7 C) (Oral)  Ht 5\' 4"  (1.626 m)  Wt 220 lb (99.791 kg)  BMI 37.74 kg/m2  LMP 07/09/2013 Physical Exam  Constitutional:  oriented to person, place, and time. appears well-developed and well-nourished.  No distress.  HENT:  Mouth/Throat: Oropharynx is clear and moist. No oropharyngeal exudate.  Cardiovascular: Normal rate, regular rhythm and normal heart sounds. Exam reveals no gallop and no friction rub.  No murmur heard.  Pulmonary/Chest: Effort normal and breath sounds normal. No respiratory distress.  has no wheezes.  Abdominal: Soft. Bowel sounds are normal.  exhibits no  distension. There is no tenderness. Gravid abdomen Lymphadenopathy: no cervical adenopathy.  Neurological: alert and oriented to person, place, and time.  Skin: Surrounding erythema to insect bite. macuale 4 cm wide  Psychiatric: a normal mood and affect. His behavior is normal.       Assessment & Plan:  Possible tick bite illness = difficult to tell that this is cellulitis from bite reaction vs. Early RMSF, and less likely lyme. Will check serology and do course of antibiotics  Vaccination = patient has agreed to get flu vaccine

## 2014-05-12 ENCOUNTER — Telehealth: Payer: Self-pay | Admitting: Nurse Practitioner

## 2014-05-12 MED ORDER — BREAST PUMP MISC: 1.0000 | Status: DC | PRN

## 2014-05-12 NOTE — Telephone Encounter (Signed)
rx faxed

## 2014-05-18 ENCOUNTER — Encounter (HOSPITAL_COMMUNITY)
Admission: RE | Admit: 2014-05-18 | Discharge: 2014-05-18 | Disposition: A | Discharge status: Home / Self Care | Payer: BC Managed Care – PPO | Source: Ambulatory Visit | Attending: Obstetrics and Gynecology | Admitting: Obstetrics and Gynecology

## 2014-05-18 ENCOUNTER — Encounter (HOSPITAL_COMMUNITY): Payer: Self-pay | Admitting: *Deleted

## 2014-05-18 DIAGNOSIS — O923 Agalactia: Secondary | ICD-10-CM | POA: Insufficient documentation

## 2014-06-01 ENCOUNTER — Other Ambulatory Visit: Payer: Self-pay | Admitting: *Deleted

## 2014-06-17 ENCOUNTER — Encounter (HOSPITAL_COMMUNITY)
Admission: RE | Admit: 2014-06-17 | Discharge: 2014-06-17 | Disposition: A | Discharge status: Home / Self Care | Payer: BC Managed Care – PPO | Source: Ambulatory Visit | Attending: Obstetrics & Gynecology | Admitting: Obstetrics & Gynecology

## 2014-06-17 DIAGNOSIS — O923 Agalactia: Secondary | ICD-10-CM | POA: Diagnosis present

## 2014-06-30 ENCOUNTER — Ambulatory Visit (INDEPENDENT_AMBULATORY_CARE_PROVIDER_SITE_OTHER): Payer: BC Managed Care – PPO | Admitting: Neurology

## 2014-06-30 ENCOUNTER — Encounter: Payer: Self-pay | Admitting: Neurology

## 2014-06-30 VITALS — BP 114/66 | HR 70 | Temp 98.6°F | Resp 16 | Ht 64.0 in | Wt 198.6 lb

## 2014-06-30 DIAGNOSIS — M6289 Other specified disorders of muscle: Secondary | ICD-10-CM

## 2014-06-30 DIAGNOSIS — R202 Paresthesia of skin: Secondary | ICD-10-CM

## 2014-06-30 DIAGNOSIS — G894 Chronic pain syndrome: Secondary | ICD-10-CM

## 2014-06-30 DIAGNOSIS — R200 Anesthesia of skin: Secondary | ICD-10-CM

## 2014-06-30 DIAGNOSIS — R29898 Other symptoms and signs involving the musculoskeletal system: Secondary | ICD-10-CM

## 2014-06-30 DIAGNOSIS — G43719 Chronic migraine without aura, intractable, without status migrainosus: Secondary | ICD-10-CM

## 2014-06-30 MED ORDER — SUMATRIPTAN SUCCINATE 100 MG PO TABS: ORAL_TABLET | ORAL | Status: DC

## 2014-06-30 MED ORDER — PREDNISONE 10 MG PO TABS: ORAL_TABLET | ORAL | Status: DC

## 2014-06-30 MED ORDER — DULOXETINE HCL 30 MG PO CPEP: ORAL_CAPSULE | ORAL | Status: DC

## 2014-06-30 NOTE — Progress Notes (Signed)
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  Records and images were personally reviewed where available.  Records and labs reviewed.  UPDATE: She started amitriptyline, but she wished to discontinue this after she found out she was pregnant.  It also caused depression.  During her pregnancy, she continued to have mild headaches but migraines dissipated.  She gave birth via spontaneous vaginal delivery in October.  Mother and baby are doing fine.  She is not breastfeeding.  Since giving birth, she has had increase in migraines. Intensity:  8-9/10 Duration:  Usually 1 day (she had one that lasted 2 weeks) Frequency:  25 headache days per month  Current abortive therapy:  none Current preventative therapy:  None She was started on Zoloft 50mg  for depression.  Since her last visit, she has had new symptoms as well.  For 9 or 10 months, she would experience occasional weakness in the hands, where she would drop objects.  Other times, her legs would get weak and she would fall.  She also notes occasional numbness and bruise-like pain in the legs as well.  At first, she thought it was related to her pregnancy, but it has persisted.  Of note, she did have a tick bite with bullseye rash in September.  ANA is positive.  Sed Rate was 38, CRP 1.4, RMSF IgM negative and B burgdorferi IgG/IgM negative.  She was treated with Ceftin.  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  She sees a hematologist.  She is scheduled to see both a rheumatologist.  She continues to have episodes of severe generalized pain as well.  She is taking Flexeril daily, which helps.  She also has Percocet but she does not use it much and doesn't like it.  She is unable to work due to the  pain.  HISTORY: Onset:  Since 23 years old, worse over the past year. Location:  Travels around head, except constant on back of head Quality:  Traveling stinging pain around head and to the eye, constant nonthrobbing pressure pain in back of head Intensity:  4-10/10 Aura:  no Associated symptoms:  Photophobia, phonophobia. Duration:  constant Frequency:  constant Triggers/exacerbating factors:  Loud high-pitch noises, lights Relieving factors: laying down, bath  Past abortive therapy:  Tylenol, ibuprofen, Excedrin migraine Past preventative therapy:  Topamax (caused a seizure), amitriptyline (exacerbated depression)  Sleep hygiene:  Can't sleep due to pain Stress/depression:  Panic attacks (onset since pain) Family history of headache:  Mom has history of migraine and fibromyalgia  Around the same time, she developed diffuse body pain.  She does report neck pain that can shoot down the spine and sometimes down the left posterior thigh to above the knee.  Sometimes, she notes a pressure across her upper back, like she is wearing a TENS unit, as well as tingling.  She also notes pain in the chest and sternum.  She has significant pain in her joints and throughout her body.  She denies warm joints, fevers or rash.  No associated numbness.  PAST MEDICAL HISTORY: Past Medical History  Diagnosis Date  . Hypothyroidism   . Anxiety   . Vitamin D deficiency   . Depression   . 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 Fibromyalgia   . Lupus   .  Warm antibody hemolytic anemia   . Diabetes mellitus without complication   . Hypertension   . GERD (gastroesophageal reflux disease)   . IBS (irritable bowel syndrome)   . Headache   . Fibromyalgia   . Hx of migraines     MEDICATIONS: Current Outpatient Prescriptions on File Prior to Visit  Medication Sig Dispense Refill  . acetaminophen (TYLENOL) 325 MG tablet Take 650 mg by mouth every 6 (six) hours as needed for mild pain or headache.      . albuterol (PROVENTIL HFA;VENTOLIN HFA) 108 (90 BASE) MCG/ACT inhaler Inhale 2 puffs into the lungs every 6 (six) hours as needed for wheezing or shortness of breath.    Marland Kitchen. ibuprofen (ADVIL,MOTRIN) 600 MG tablet Take 1 tablet (600 mg total) by mouth every 6 (six) hours. 30 tablet 1  . Misc. Devices (BREAST PUMP) MISC 1 each by Does not apply route as needed. 1 each 0  . oxyCODONE-acetaminophen (PERCOCET/ROXICET) 5-325 MG per tablet Take 1 tablet by mouth every 4 (four) hours as needed (for pain scale less than 7). 30 tablet 0  . Prenatal Vit-Fe Fumarate-FA (PRENATAL MULTIVITAMIN) TABS tablet Take 1 tablet by mouth daily.     No current facility-administered medications on file prior to visit.    ALLERGIES: Allergies  Allergen Reactions  . Celexa [Citalopram Hydrobromide] Diarrhea and Other (See Comments)    Pt states that this medication caused her limbs to go numb.   . Ranitidine Diarrhea and Other (See Comments)    Pt states that this causes severe stomach pain.   . Topamax [Topiramate] Nausea And Vomiting and Other (See Comments)    Pt states that this medication caused seizures and she was unable to speak.     FAMILY HISTORY: Family History  Problem Relation Age of Onset  . Fibromyalgia Mother   . Heart disease Mother   . Hypertension Mother   . Lupus Mother   . Hypertension Father   . Diabetes Paternal Grandfather     SOCIAL HISTORY: History   Social History  . Marital Status: Married    Spouse Name: N/A    Number of Children: N/A  . Years of Education: N/A   Occupational History  . Not on file.   Social History Main Topics  . Smoking status: Former Smoker    Quit date: 07/29/2013  . Smokeless tobacco: Never Used  . Alcohol Use: No     Comment: occ  . Drug Use: No  . Sexual Activity:    Partners: Male   Other Topics Concern  . Not on file   Social History Narrative    REVIEW OF SYSTEMS: Constitutional: No fevers, chills, or sweats, no generalized  fatigue, change in appetite Eyes: No visual changes, double vision, eye pain Ear, nose and throat: No hearing loss, ear pain, nasal congestion, sore throat Cardiovascular: No chest pain, palpitations Respiratory:  No shortness of breath at rest or with exertion, wheezes GastrointestinaI: No nausea, vomiting, diarrhea, abdominal pain, fecal incontinence Genitourinary:  No dysuria, urinary retention or frequency Musculoskeletal:  Generalized pain Integumentary: No rash, pruritus, skin lesions Neurological: as above Psychiatric: Depression Endocrine: No palpitations, fatigue, diaphoresis, mood swings, change in appetite, change in weight, increased thirst Hematologic/Lymphatic:  No anemia, purpura, petechiae. Allergic/Immunologic: no itchy/runny eyes, nasal congestion, recent allergic reactions, rashes  PHYSICAL EXAM: Filed Vitals:   06/30/14 1428  BP: 114/66  Pulse: 70  Temp: 98.6 F (37 C)  Resp: 16   General: No acute distress Head:  Normocephalic/atraumatic Eyes:  Fundoscopic exam unremarkable without vessel changes, exudates, hemorrhages or papilledema. Neck: supple, no paraspinal tenderness, full range of motion Heart:  Regular rate and rhythm Lungs:  Clear to auscultation bilaterally Back: No paraspinal tenderness Neurological Exam: alert and oriented to person, place, and time. Attention span and concentration intact, recent and remote memory intact, fund of knowledge intact.  Speech fluent and not dysarthric, language intact.  CN II-XII intact. Fundoscopic exam unremarkable without vessel changes, exudates, hemorrhages or papilledema.  Bulk and tone normal, muscle strength 5/5 throughout.  Sensation to light touch, temperature and vibration intact.  Deep tendon reflexes 2+ throughout, toes downgoing.  Finger to nose and heel to shin testing intact.  Gait normal, Romberg negative.  IMPRESSION: Chronic migraine without aura Chronic pain syndrome  Bilateral hand and leg  weakness Numbness and tingling  PLAN: 1.  Will get MRI of brain with and without contrast to evaluate for cause of weakness and numbness in the hands and legs. 2.  Will start Cymbalta 30mg  daily x1 week and then increase to 60mg  daily.  This will address chronic migraine and generalized pain. 3.  Sumatriptan 100mg  for abortive migraine therapy 4.  Prednisone taper to break cycle of daily headache 5.  Follow up in 4 weeks  Shon Millet, DO  CC:  Rudi Heap, MD

## 2014-06-30 NOTE — Addendum Note (Signed)
Addended by: Glean SalenVAN DER GLAS, Zeddie Njie E on: 06/30/2014 04:27 PM   Modules accepted: Orders

## 2014-06-30 NOTE — Patient Instructions (Signed)
1.  Start Cymbalta 30mg  daily for 7 days.  Then increase to 2 pills (60mg ) daily.  This will address chronic pain and migraines. 2.  Take sumatriptan 100mg  at earliest onset of headache.  May repeat once in 2 hours if needed. 3.  Take the prednisone taper to help break the headaches.  Take 6tabs x1day, then 5tabs x1day, then 4tabs x1day, then 3tabs x1day, then 2tabs x1day, then 1tab x1day, then STOP 4.  Will get MRI of brain with and without contrast 5.  Have rheumatologist send note to me 6.  Follow up in 4 weeks.

## 2014-07-06 ENCOUNTER — Telehealth: Payer: Self-pay | Admitting: Hematology and Oncology

## 2014-07-06 NOTE — Telephone Encounter (Signed)
returned pt call and lvm for pt regarding to new appt d.t

## 2014-07-13 ENCOUNTER — Ambulatory Visit (HOSPITAL_COMMUNITY)
Admission: RE | Admit: 2014-07-13 | Discharge: 2014-07-13 | Disposition: A | Discharge status: Home / Self Care | Payer: BC Managed Care – PPO | Source: Ambulatory Visit | Attending: Neurology | Admitting: Neurology

## 2014-07-13 DIAGNOSIS — G894 Chronic pain syndrome: Secondary | ICD-10-CM | POA: Insufficient documentation

## 2014-07-13 DIAGNOSIS — R29898 Other symptoms and signs involving the musculoskeletal system: Secondary | ICD-10-CM

## 2014-07-13 DIAGNOSIS — G43119 Migraine with aura, intractable, without status migrainosus: Secondary | ICD-10-CM | POA: Diagnosis present

## 2014-07-13 DIAGNOSIS — M329 Systemic lupus erythematosus, unspecified: Secondary | ICD-10-CM | POA: Insufficient documentation

## 2014-07-13 DIAGNOSIS — G43719 Chronic migraine without aura, intractable, without status migrainosus: Secondary | ICD-10-CM

## 2014-07-13 DIAGNOSIS — R202 Paresthesia of skin: Secondary | ICD-10-CM

## 2014-07-13 DIAGNOSIS — R200 Anesthesia of skin: Secondary | ICD-10-CM | POA: Diagnosis not present

## 2014-07-13 DIAGNOSIS — M797 Fibromyalgia: Secondary | ICD-10-CM | POA: Insufficient documentation

## 2014-07-13 LAB — POCT I-STAT CREATININE: CREATININE: 0.9 mg/dL (ref 0.50–1.10)

## 2014-07-13 MED ORDER — GADOBENATE DIMEGLUMINE 529 MG/ML IV SOLN
20.0000 mL | Freq: Once | INTRAVENOUS | Status: AC | PRN
  Administered 2014-07-13: 19 mL via INTRAVENOUS

## 2014-07-14 ENCOUNTER — Telehealth: Payer: Self-pay | Admitting: *Deleted

## 2014-07-14 NOTE — Telephone Encounter (Signed)
Aware of normal MRI

## 2014-07-24 ENCOUNTER — Ambulatory Visit (HOSPITAL_BASED_OUTPATIENT_CLINIC_OR_DEPARTMENT_OTHER): Payer: BLUE CROSS/BLUE SHIELD | Admitting: Hematology and Oncology

## 2014-07-24 ENCOUNTER — Encounter: Payer: Self-pay | Admitting: Hematology and Oncology

## 2014-07-24 ENCOUNTER — Other Ambulatory Visit (HOSPITAL_BASED_OUTPATIENT_CLINIC_OR_DEPARTMENT_OTHER): Payer: BLUE CROSS/BLUE SHIELD

## 2014-07-24 VITALS — BP 135/80 | Temp 98.6°F | Resp 18 | Ht 64.0 in | Wt 199.0 lb

## 2014-07-24 DIAGNOSIS — R768 Other specified abnormal immunological findings in serum: Secondary | ICD-10-CM

## 2014-07-24 DIAGNOSIS — D591 Other autoimmune hemolytic anemias: Secondary | ICD-10-CM

## 2014-07-24 DIAGNOSIS — D5911 Warm autoimmune hemolytic anemia: Secondary | ICD-10-CM

## 2014-07-24 LAB — COMPREHENSIVE METABOLIC PANEL (CC13)
ALBUMIN: 3.9 g/dL (ref 3.5–5.0)
ALT: 39 U/L (ref 0–55)
AST: 22 U/L (ref 5–34)
Alkaline Phosphatase: 90 U/L (ref 40–150)
Anion Gap: 7 mEq/L (ref 3–11)
BUN: 7.4 mg/dL (ref 7.0–26.0)
CHLORIDE: 109 meq/L (ref 98–109)
CO2: 23 mEq/L (ref 22–29)
Calcium: 9.5 mg/dL (ref 8.4–10.4)
Creatinine: 0.8 mg/dL (ref 0.6–1.1)
EGFR: >90 mL/min/{1.73_m2} (ref >90)
Glucose: 92 mg/dl (ref 70–140)
Potassium: 4 mEq/L (ref 3.5–5.1)
SODIUM: 138 meq/L (ref 136–145)
Total Bilirubin: 0.58 mg/dL (ref 0.20–1.20)
Total Protein: 7.2 g/dL (ref 6.4–8.3)

## 2014-07-24 LAB — CBC & DIFF AND RETIC
BASO%: 0.3 % (ref 0.0–2.0)
BASOS ABS: 0 10*3/uL (ref 0.0–0.1)
EOS ABS: 0.2 10*3/uL (ref 0.0–0.5)
EOS%: 2.5 % (ref 0.0–7.0)
HCT: 39.7 % (ref 34.8–46.6)
HEMOGLOBIN: 13.4 g/dL (ref 11.6–15.9)
Immature Retic Fract: 3.9 % (ref 1.60–10.00)
LYMPH%: 26.9 % (ref 14.0–49.7)
MCH: 28.3 pg (ref 25.1–34.0)
MCHC: 33.8 g/dL (ref 31.5–36.0)
MCV: 83.8 fL (ref 79.5–101.0)
MONO#: 0.5 10*3/uL (ref 0.1–0.9)
MONO%: 7.5 % (ref 0.0–14.0)
NEUT#: 4 10*3/uL (ref 1.5–6.5)
NEUT%: 62.8 % (ref 38.4–76.8)
PLATELETS: 366 10*3/uL (ref 145–400)
RBC: 4.74 10*6/uL (ref 3.70–5.45)
RDW: 13.5 % (ref 11.2–14.5)
Retic %: 2.03 % (ref 0.70–2.10)
Retic Ct Abs: 96.22 10*3/uL — ABNORMAL HIGH (ref 33.70–90.70)
WBC: 6.3 10*3/uL (ref 3.9–10.3)
lymph#: 1.7 10*3/uL (ref 0.9–3.3)

## 2014-07-24 LAB — URINALYSIS, MICROSCOPIC - CHCC
BILIRUBIN (URINE): NEGATIVE
Blood: NEGATIVE
Glucose: NEGATIVE mg/dL
KETONES: NEGATIVE mg/dL
Leukocyte Esterase: NEGATIVE
NITRITE: NEGATIVE
PH: 6 (ref 4.6–8.0)
PROTEIN: NEGATIVE mg/dL
RBC / HPF: NEGATIVE (ref 0–2)
Specific Gravity, Urine: 1.02 (ref 1.003–1.035)
UROBILINOGEN UR: 0.2 mg/dL (ref 0.2–1)

## 2014-07-24 LAB — LACTATE DEHYDROGENASE (CC13): LDH: 153 U/L (ref 125–245)

## 2014-07-24 NOTE — Progress Notes (Signed)
[place] NOTE  [name], MD SUMMARY OF HEMATOLOGIC HISTORY: [name] [name] 24 y.o. female is here because of abnormal labs. The patient was pregnant with her first born in March 2015. 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 As part of her routine test, antibody screen test was done and pan-reactivity was seen, worrisome for warm autoimmune hemolytic anemia. The patient stated she has passage of dark urine and has chronic fatigue. She was diagnosed with positive ANA test; rheumatology consultation is pending. She has skin rashes and join pain affecting her knees and hands. She was subsequently diagnosed with systemic lupus. The patient was lost to follow-up. INTERVAL HISTORY: [name] [name] 24 y.o. female returns for further follow-up. She had gestational diabetes but with a relatively uncomplicated pregnancy with the daughter who was born full term. After pregnancy, she complained of progressive fatigue and has significant muscle aches and pain. She returns today to follow-up on the diagnosis of autoimmune hemolytic anemia. She has seen neurologists recently for intractable headaches which responded to prednisone therapy  I have reviewed the past medical history, past surgical history, social history and family history with the patient and they are unchanged from previous note.  ALLERGIES:  is allergic to celexa; ranitidine; and topamax.  MEDICATIONS:  Current Outpatient Prescriptions  Medication Sig Dispense Refill  . acetaminophen (TYLENOL) 325 MG tablet Take 650 mg by mouth every 6 (six) hours as needed for mild pain or headache.     . albuterol (PROVENTIL HFA;VENTOLIN HFA) 108 (90 BASE) MCG/ACT inhaler Inhale 2 puffs into the lungs every 6 (six) hours as needed for wheezing or shortness of breath.    . cyclobenzaprine (FLEXERIL) 10 MG tablet Take 10 mg by mouth 3 (three) times daily as needed for muscle spasms.    .  DULoxetine (CYMBALTA) 30 MG capsule Take 1 cap daily for 7d, then 2 caps daily 60 capsule 0  . Eluxadoline (VIBERZI) 100 MG TABS Take by mouth 2 (two) times daily.    Marland Kitchen ibuprofen (ADVIL,MOTRIN) 600 MG tablet Take 1 tablet (600 mg total) by mouth every 6 (six) hours. 30 tablet 1  . sertraline (ZOLOFT) 50 MG tablet   0  . SUMAtriptan (IMITREX) 100 MG tablet Take 1 tab at earliest onset of headache.  May repeat x1 in 2 hours if headache persists or recurs. 10 tablet 2  . oxyCODONE-acetaminophen (PERCOCET/ROXICET) 5-325 MG per tablet Take 1 tablet by mouth every 4 (four) hours as needed (for pain scale less than 7). (Patient not taking: Reported on 07/24/2014) 30 tablet 0  . predniSONE (DELTASONE) 10 MG tablet Take 6tabs x1day, then 5tabs x1day, then 4tabs x1day, then 3tabs x1day, then 2tabs x1day, then 1tab x1day, then STOP (Patient not taking: Reported on 07/24/2014) 21 tablet 0   No current facility-administered medications for this visit.     REVIEW OF SYSTEMS:   Constitutional: Denies fevers, chills or night sweats Eyes: Denies blurriness of vision Ears, nose, mouth, throat, and face: Denies mucositis or sore throat Respiratory: Denies cough, dyspnea or wheezes Cardiovascular: Denies palpitation, chest discomfort or lower extremity swelling Gastrointestinal:  Denies nausea, heartburn or change in bowel habits Skin: Denies abnormal skin rashes Lymphatics: Denies new lymphadenopathy or easy bruising Neurological:Denies numbness, tingling or new weaknesses Behavioral/Psych: Mood is stable, no new changes  All other systems were reviewed with the patient and are negative.  PHYSICAL EXAMINATION: ECOG PERFORMANCE STATUS: 0 - Asymptomatic  Filed Vitals:   07/24/14 1330  BP: 135/80  Temp: 98.6 F (37 C)  Resp: 18   Filed Weights   07/24/14 1330  Weight: 199 lb (90.266 kg)    GENERAL:alert, no distress and comfortable. She is morbidly obese SKIN: skin color, texture, turgor are normal, no  rashes or significant lesions EYES: normal, Conjunctiva are pink and non-injected, sclera clear NEURO: alert & oriented x 3 with fluent speech, no focal motor/sensory deficits  LABORATORY DATA:  I have reviewed the data as listed Results for orders placed or performed in visit on 07/24/14 (from the past 48 hour(s))  Lactate dehydrogenase     Status: None   Collection Time: 07/24/14  1:00 PM  Result Value Ref Range   LDH 153 125 - 245 U/L  CBC & Diff and Retic     Status: Abnormal   Collection Time: 07/24/14  1:00 PM  Result Value Ref Range   WBC 6.3 3.9 - 10.3 10e3/uL   NEUT# 4.0 1.5 - 6.5 10e3/uL   HGB 13.4 11.6 - 15.9 g/dL   HCT 39.7 34.8 - 46.6 %   Platelets 366 145 - 400 10e3/uL   MCV 83.8 79.5 - 101.0 fL   MCH 28.3 25.1 - 34.0 pg   MCHC 33.8 31.5 - 36.0 g/dL   RBC 4.74 3.70 - 5.45 10e6/uL   RDW 13.5 11.2 - 14.5 %   lymph# 1.7 0.9 - 3.3 10e3/uL   MONO# 0.5 0.1 - 0.9 10e3/uL   Eosinophils Absolute 0.2 0.0 - 0.5 10e3/uL   Basophils Absolute 0.0 0.0 - 0.1 10e3/uL   NEUT% 62.8 38.4 - 76.8 %   LYMPH% 26.9 14.0 - 49.7 %   MONO% 7.5 0.0 - 14.0 %   EOS% 2.5 0.0 - 7.0 %   BASO% 0.3 0.0 - 2.0 %   Retic % 2.03 0.70 - 2.10 %   Retic Ct Abs 96.22 (H) 33.70 - 90.70 10e3/uL   Immature Retic Fract 3.90 1.60 - 10.00 %  Comprehensive metabolic panel     Status: None   Collection Time: 07/24/14  1:00 PM  Result Value Ref Range   Sodium 138 136 - 145 mEq/L   Potassium 4.0 3.5 - 5.1 mEq/L   Chloride 109 98 - 109 mEq/L   CO2 23 22 - 29 mEq/L   Glucose 92 70 - 140 mg/dl   BUN 7.4 7.0 - 26.0 mg/dL   Creatinine 0.8 0.6 - 1.1 mg/dL   Total Bilirubin 0.58 0.20 - 1.20 mg/dL   Alkaline Phosphatase 90 40 - 150 U/L   AST 22 5 - 34 U/L   ALT 39 0 - 55 U/L   Total Protein 7.2 6.4 - 8.3 g/dL   Albumin 3.9 3.5 - 5.0 g/dL   Calcium 9.5 8.4 - 10.4 mg/dL   Anion Gap 7 3 - 11 mEq/L   EGFR >90 >90 ml/min/1.73 m2    Comment: eGFR is calculated using the CKD-EPI Creatinine Equation (2009)   Urinalysis, Microscopic - [facility]     Status: None   Collection Time: 07/24/14  1:00 PM  Result Value Ref Range   Glucose Negative Negative mg/dL   Bilirubin (Urine) Negative Negative   Ketones Negative Negative mg/dL   Specific Gravity, Urine 1.020 1.003 - 1.035   Blood Negative Negative   pH 6.0 4.6 - 8.0   Protein Negative Negative- <30 mg/dL   Urobilinogen, UR 0.2 0.2 - 1 mg/dL   Nitrite Negative Negative   Leukocyte Esterase Negative Negative   RBC / HPF  Negative 0 - 2   WBC, UA 0-2 0 - 2   Bacteria, UA Few Negative- Trace   Epithelial Cells Few Negative- Few   Mucus, UA Moderate Negative- Small    Lab Results  Component Value Date   WBC 6.3 07/24/2014   HGB 13.4 07/24/2014   HCT 39.7 07/24/2014   MCV 83.8 07/24/2014   PLT 366 07/24/2014    ASSESSMENT & PLAN:  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 Currently, she is not anemic. I suspect she is not anemic now possibly related to recent treatment with prednisone. I educated the patient's signs and symptoms to watch out for hemolysis such as passage of brown urine, increased fatigue and symptoms of anemia. If she have those symptoms, the patient will call me and I will bring her back. In the meantime, I recommend she follows closely with rheumatologist for treatment of systemic lupus     All questions were answered. The patient knows to call the clinic with any problems, questions or concerns. No barriers to learning was detected.  I spent 15 minutes counseling the patient face to face. The total time spent in the appointment was 20 minutes and more than 50% was on counseling.     [facility], [place], MD 07/24/2014 2:11 PM

## 2014-07-24 NOTE — Assessment & Plan Note (Addendum)
This is likely associated with her systemic lupus. Currently, she is not anemic. I suspect she is not anemic now possibly related to recent treatment with prednisone. I educated the patient's signs and symptoms to watch out for hemolysis such as passage of brown urine, increased fatigue and symptoms of anemia. If she have those symptoms, the patient will call me and I will bring her back. In the meantime, I recommend she follows closely with rheumatologist for treatment of systemic lupus

## 2014-07-25 LAB — DIRECT ANTIGLOBULIN TEST (NOT AT ARMC)
DAT (COMPLEMENT): NEGATIVE
DAT IGG: NEGATIVE

## 2014-08-11 ENCOUNTER — Telehealth: Payer: Self-pay | Admitting: Family

## 2014-08-11 DIAGNOSIS — R059 Cough, unspecified: Secondary | ICD-10-CM

## 2014-08-11 DIAGNOSIS — J3089 Other allergic rhinitis: Secondary | ICD-10-CM

## 2014-08-11 DIAGNOSIS — R05 Cough: Secondary | ICD-10-CM

## 2014-08-12 ENCOUNTER — Other Ambulatory Visit: Payer: Self-pay | Admitting: *Deleted

## 2014-08-12 ENCOUNTER — Encounter: Payer: Self-pay | Admitting: Neurology

## 2014-08-12 ENCOUNTER — Ambulatory Visit (INDEPENDENT_AMBULATORY_CARE_PROVIDER_SITE_OTHER): Payer: BLUE CROSS/BLUE SHIELD | Admitting: Neurology

## 2014-08-12 VITALS — BP 126/70 | HR 70 | Temp 98.3°F | Resp 18 | Ht 64.0 in | Wt 203.6 lb

## 2014-08-12 DIAGNOSIS — G43719 Chronic migraine without aura, intractable, without status migrainosus: Secondary | ICD-10-CM

## 2014-08-12 DIAGNOSIS — M797 Fibromyalgia: Secondary | ICD-10-CM

## 2014-08-12 MED ORDER — FLUTICASONE PROPIONATE 50 MCG/ACT NA SUSP: 2.0000 | Freq: Every day | NASAL | Status: DC

## 2014-08-12 MED ORDER — SERTRALINE HCL 25 MG PO TABS: 75.0000 mg | ORAL_TABLET | Freq: Every day | ORAL | Status: DC

## 2014-08-12 MED ORDER — RIZATRIPTAN BENZOATE 10 MG PO TBDP: 10.0000 mg | ORAL_TABLET | ORAL | Status: DC | PRN

## 2014-08-12 MED ORDER — BENZONATATE 100 MG PO CAPS: 100.0000 mg | ORAL_CAPSULE | Freq: Three times a day (TID) | ORAL | Status: DC | PRN

## 2014-08-12 NOTE — Progress Notes (Signed)
The E-visit program has experienced intermittent technical difficulties during the past 8 hours which may have caused some delays in our response times. This has now been resolved. We sincerely apologize if you are one of the patients who has been affected by this and we hope that we were able to provide proper care to suit your needs. If you have any questions or concerns about this visit, please let us know.  We are sorry that you are not feeling well.  Here is how we plan to help!  Based on what you have shared with me it looks like you have a condition that has resulted in a significant cough.  Inflammation and infection in the upper respiratory tract is commonly called bronchitis and has four common causes:  Allergies, Viral Infections, Acid Reflux and Bacterial Infections.  Allergies, viruses and acid reflux are treated by controlling symptoms or eliminating the cause. An example might be a cough caused by taking certain blood pressure medications. You stop the cough by changing the medication. Another example might be a cough caused by acid reflux. Controlling the reflux helps control the cough.  Based on your presentation I believe you most likely have A cough due to allergies.  I recommend that you start the an over-the counter-allergy medication such as Claritin 10 mg or Zyrtec 10 mg daily.  You indicated that you are out of Zyrtec (Cetirizine) and this is something that can be purchased at any drugstore for a very low price (below a copay typically). I will gladly refill your Flonase since you report that this works well.    In addition you may use A non-prescription cough medication called Robitussin DAC. Take 2 teaspoons every 8 hours or Delsym: take 2 teaspoons every 12 hours., A non-prescription cough medication called Mucinex DM: take 2 tablets every 12 hours. and A prescription cough medication called Tessalon Perles 100mg . You may take 1-2 capsules every 8 hours as needed for your cough.   HOME CARE . Only take medications as instructed by your medical team. . Complete the entire course of an antibiotic. . Drink plenty of fluids and get plenty of rest. . Avoid close contacts especially the very young and the elderly . Cover your mouth if you cough or cough into your sleeve. . Always remember to wash your hands . A steam or ultrasonic humidifier can help congestion.    GET HELP RIGHT AWAY IF: . You develop worsening fever. . You become short of breath . You cough up blood. . Your symptoms persist after you have completed your treatment plan MAKE SURE YOU   Understand these instructions.  Will watch your condition.  Will get help right away if you are not doing well or get worse.  Your e-visit answers were reviewed by a board certified advanced clinical practitioner to complete your personal care plan.  Depending on the condition, your plan could have included both over the counter or prescription medications.  If there is a problem please reply  once you have received a response from your provider.  Your safety is important to us.  If you have drug allergies check your prescription carefully.    You can use MyChart to ask questions about today's visit, request a non-urgent call back, or ask for a work or school excuse.  You will get an e-mail in the next two days asking about your experience.  I hope that your e-visit has been valuable and will speed your recovery. Thank you  for using e-visits.

## 2014-08-12 NOTE — Progress Notes (Addendum)
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  Records and images were personally reviewed.  UPDATE: Headaches are still daily.  Sleep is poor.  She attributes both to her fibromyalgia. Intensity:  4/10 (8/10 when severe) Duration:  constant Frequency:  Constant (severe 2-3 times a week)  Current abortive therapy:  Sumatriptan 100mg  (ineffective) Current preventative therapy:  Rheumatologist discontinued Cymbalta 60mg  Other medication:  Zoloft 50mg  for depression.  MRI of brain with and without contrast performed on 07/13/14 was unremarkable. TSH from 08/11/14 was 0.020.  HISTORY: Onset:  Since 24 years old, worse over the past year. Location:  Travels around head, except constant on back of head Quality:  Traveling stinging pain around head and to the eye, constant nonthrobbing pressure pain in back of head Intensity:  4-10/10; December 8-9/10 Aura:  no Associated symptoms:  Photophobia, phonophobia. Duration:  constant; December Usually 1 day (she had one that lasted 2 weeks) Frequency:  constant; December 25 headache days per month Triggers/exacerbating factors:  Loud high-pitch noises, lights Relieving factors: laying down, bath  Past abortive therapy:  Tylenol, ibuprofen, Excedrin migraine Past preventative therapy:  Topamax (caused a seizure), amitriptyline (exacerbated depression)  She started amitriptyline, but she wished to discontinue this after she found out she was pregnant.  It also caused depression.  During her pregnancy, she continued to have mild headaches but migraines dissipated.  She gave birth via spontaneous vaginal delivery in October.  Mother and baby are doing fine.  She is not  breastfeeding.  Since giving birth, she has had increase in migraines.  Sleep hygiene:  Can't sleep due to pain Stress/depression:  Panic attacks (onset since pain) Family history of headache:  Mom has history of migraine and fibromyalgia  Around the same time, she developed diffuse body pain.  She does report neck pain that can shoot down the spine and sometimes down the left posterior thigh to above the knee.  Sometimes, she notes a pressure across her upper back, like she is wearing a TENS unit, as well as tingling.  She also notes pain in the chest and sternum.  She has significant pain in her joints and throughout her body.  She denies warm joints, fevers or rash.  No associated numbness.  PAST MEDICAL HISTORY: Past Medical History  Diagnosis Date  . Hypothyroidism   . Anxiety   . Vitamin D deficiency   . Depression   . 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 Fibromyalgia   . Lupus   . Warm antibody hemolytic anemia   . Diabetes mellitus without complication   . Hypertension   . GERD (gastroesophageal reflux disease)   . IBS (irritable bowel syndrome)   . Headache   . Fibromyalgia   . Hx of migraines     MEDICATIONS: Current Outpatient Prescriptions on File Prior to Visit  Medication Sig Dispense Refill  . acetaminophen (TYLENOL) 325 MG tablet Take 650 mg by mouth every 6 (six) hours as needed for mild pain or headache.     . albuterol (PROVENTIL HFA;VENTOLIN HFA) 108 (90 BASE) MCG/ACT inhaler Inhale 2 puffs into the lungs every 6 (six) hours as needed for wheezing or shortness of breath.    . benzonatate (TESSALON PERLES) 100 MG capsule Take 1-2 capsules (100-200 mg  total) by mouth every 8 (eight) hours as needed for cough. (Patient not taking: Reported on 08/12/2014) 30 capsule 0  . cyclobenzaprine (FLEXERIL) 10 MG tablet Take 10 mg by mouth 3 (three) times daily as needed for muscle spasms.    . DULoxetine (CYMBALTA) 30 MG capsule Take 1 cap daily for 7d, then 2 caps daily  (Patient not taking: Reported on 08/12/2014) 60 capsule 0  . Eluxadoline (VIBERZI) 100 MG TABS Take by mouth 2 (two) times daily.    . fluticasone (FLONASE) 50 MCG/ACT nasal spray Place 2 sprays into both nostrils daily. 16 g 6  . ibuprofen (ADVIL,MOTRIN) 600 MG tablet Take 1 tablet (600 mg total) by mouth every 6 (six) hours. 30 tablet 1  . oxyCODONE-acetaminophen (PERCOCET/ROXICET) 5-325 MG per tablet Take 1 tablet by mouth every 4 (four) hours as needed (for pain scale less than 7). (Patient not taking: Reported on 07/24/2014) 30 tablet 0  . predniSONE (DELTASONE) 10 MG tablet Take 6tabs x1day, then 5tabs x1day, then 4tabs x1day, then 3tabs x1day, then 2tabs x1day, then 1tab x1day, then STOP (Patient not taking: Reported on 07/24/2014) 21 tablet 0  . SUMAtriptan (IMITREX) 100 MG tablet Take 1 tab at earliest onset of headache.  May repeat x1 in 2 hours if headache persists or recurs. 10 tablet 2   No current facility-administered medications on file prior to visit.    ALLERGIES: Allergies  Allergen Reactions  . Celexa [Citalopram Hydrobromide] Diarrhea and Other (See Comments)    Pt states that this medication caused her limbs to go numb.   . Ranitidine Diarrhea and Other (See Comments)    Pt states that this causes severe stomach pain.   . Topamax [Topiramate] Nausea And Vomiting and Other (See Comments)    Pt states that this medication caused seizures and she was unable to speak.     FAMILY HISTORY: Family History  Problem Relation Age of Onset  . Fibromyalgia Mother   . Heart disease Mother   . Hypertension Mother   . Lupus Mother   . Hypertension Father   . Diabetes Paternal Grandfather     SOCIAL HISTORY: History   Social History  . Marital Status: Married    Spouse Name: N/A    Number of Children: N/A  . Years of Education: N/A   Occupational History  . Not on file.   Social History Main Topics  . Smoking status: Former Smoker    Quit date: 07/29/2013  . Smokeless  tobacco: Never Used  . Alcohol Use: No  . Drug Use: No  . Sexual Activity:    Partners: Male   Other Topics Concern  . Not on file   Social History Narrative    REVIEW OF SYSTEMS: Constitutional: No fevers, chills, or sweats, no generalized fatigue, change in appetite Eyes: No visual changes, double vision, eye pain Ear, nose and throat: No hearing loss, ear pain, nasal congestion, sore throat Cardiovascular: No chest pain, palpitations Respiratory:  No shortness of breath at rest or with exertion, wheezes GastrointestinaI: No nausea, vomiting, diarrhea, abdominal pain, fecal incontinence Genitourinary:  No dysuria, urinary retention or frequency Musculoskeletal:  No neck pain, back pain Integumentary: No rash, pruritus, skin lesions Neurological: as above Psychiatric: No depression, insomnia, anxiety Endocrine: No palpitations, fatigue, diaphoresis, mood swings, change in appetite, change in weight, increased thirst Hematologic/Lymphatic:  No anemia, purpura, petechiae. Allergic/Immunologic: no itchy/runny eyes, nasal congestion, recent allergic reactions, rashes  PHYSICAL EXAM: Filed Vitals:   08/12/14 1018  BP: 126/70  Pulse: 70  Temp: 98.3 F (36.8 C)  Resp: 18   General: No acute distress Head:  Normocephalic/atraumatic Eyes:  Fundoscopic exam unremarkable without vessel changes, exudates, hemorrhages or papilledema. Neck: supple, no paraspinal tenderness, full range of motion Heart:  Regular rate and rhythm Lungs:  Clear to auscultation bilaterally Back: No paraspinal tenderness Neurological Exam: alert and oriented to person, place, and time. Attention span and concentration intact, recent and remote memory intact, fund of knowledge intact.  Speech fluent and not dysarthric, language intact.  CN II-XII intact. Fundoscopic exam unremarkable without vessel changes, exudates, hemorrhages or papilledema.  Bulk and tone normal, muscle strength 5/5 throughout.  Sensation  to light touch  intact.  Deep tendon reflexes 2+ throughout.  Finger to nose and heel to shin testing intact.  Gait normal.  IMPRESSION: Chronic migraine without aura Fibromyalgia  PLAN: Increase Zoloft to  daily Take Maxalt  for abortive therapy Refer for biofeedback for both chronic migraines and fibromyalgia Take Flexeril for neck pain Follow up in 3 months.  Call in 4 weeks with update  We discussed other treatment options, including cognitive behavioral therapy, Botox, Depakote or switching Zoloft to Effexor.  I did provide her literature on Botox.  Most importantly, she needs to work on lifestyle modification.  Shon Millet, DO  CC:  Rudi Heap, MD  Alben Deeds, MD

## 2014-08-12 NOTE — Patient Instructions (Signed)
Increase Zoloft to 75mg  daily (filled new prescription to take three 25mg  tablets at once) At earliest onset of headache, take Maxalt 10mg .  May repeat once in 2 hours if needed.  Do not exceed 2 tablets in 24 hours Refer for biofeedback Take Flexeril for neck pain Follow up in 3 months.  Call in 4 weeks with update.

## 2014-08-13 ENCOUNTER — Telehealth: Payer: Self-pay | Admitting: *Deleted

## 2014-08-13 NOTE — Telephone Encounter (Signed)
Left message for patient to call office back [facility]  records faxed

## 2014-08-14 ENCOUNTER — Encounter: Payer: Self-pay | Admitting: Family Medicine

## 2014-08-14 ENCOUNTER — Telehealth: Payer: Self-pay | Admitting: Neurology

## 2014-08-14 ENCOUNTER — Ambulatory Visit (INDEPENDENT_AMBULATORY_CARE_PROVIDER_SITE_OTHER): Payer: BLUE CROSS/BLUE SHIELD | Admitting: Family Medicine

## 2014-08-14 ENCOUNTER — Telehealth: Payer: Self-pay | Admitting: *Deleted

## 2014-08-14 VITALS — BP 128/76 | HR 79 | Temp 98.7°F | Ht 64.0 in | Wt 206.0 lb

## 2014-08-14 DIAGNOSIS — J4520 Mild intermittent asthma, uncomplicated: Secondary | ICD-10-CM

## 2014-08-14 MED ORDER — ALBUTEROL SULFATE HFA 108 (90 BASE) MCG/ACT IN AERS: 2.0000 | INHALATION_SPRAY | Freq: Four times a day (QID) | RESPIRATORY_TRACT | Status: AC | PRN

## 2014-08-14 MED ORDER — FLUTICASONE-SALMETEROL 250-50 MCG/DOSE IN AEPB: 1.0000 | INHALATION_SPRAY | Freq: Two times a day (BID) | RESPIRATORY_TRACT | Status: DC

## 2014-08-14 NOTE — Progress Notes (Signed)
   Subjective:    Patient ID: Faustino CongressLyndsay Eid, female    DOB: 10-Oct-1990, 24 y.o.   MRN: [id number]  HPI Patient c/o wheezing and chest tightness.  Review of Systems  Constitutional: Negative for fever.  HENT: Negative for ear pain.   Eyes: Negative for discharge.  Respiratory: Negative for cough.   Cardiovascular: Negative for chest pain.  Gastrointestinal: Negative for abdominal distention.  Endocrine: Negative for polyuria.  Genitourinary: Negative for difficulty urinating.  Musculoskeletal: Negative for gait problem and neck pain.  Skin: Negative for color change and rash.  Neurological: Negative for speech difficulty and headaches.  Psychiatric/Behavioral: Negative for agitation.       Objective:    BP 128/76 mmHg  Pulse 79  Temp(Src) 98.7 F (37.1 C) (Oral)  Ht 5\' 4"  (1.626 m)  Wt 206 lb (93.441 kg)  BMI 35.34 kg/m2  LMP 07/27/2014 Physical Exam  Constitutional: She is oriented to person, place, and time. She appears well-developed and well-nourished.  HENT:  Head: Normocephalic and atraumatic.  Mouth/Throat: Oropharynx is clear and moist.  Eyes: Pupils are equal, round, and reactive to light.  Neck: Normal range of motion. Neck supple.  Cardiovascular: Normal rate and regular rhythm.   No murmur heard. Pulmonary/Chest: Effort normal and breath sounds normal.  Abdominal: Soft. Bowel sounds are normal. There is no tenderness.  Neurological: She is alert and oriented to person, place, and time.  Skin: Skin is warm and dry.  Psychiatric: She has a normal mood and affect.          Assessment & Plan:     ICD-9-CM ICD-10-CM   1. Asthma, chronic, mild intermittent, uncomplicated 493.90 J45.20 albuterol (PROVENTIL HFA;VENTOLIN HFA) 108 (90 BASE) MCG/ACT inhaler     Fluticasone-Salmeterol (ADVAIR DISKUS) 250-50 MCG/DOSE AEPB     No Follow-up on file.  Deatra CanterWilliam J Oxford FNP

## 2014-08-14 NOTE — Telephone Encounter (Signed)
Pt called requesting a refill for FLEXERIL. Pt stated that she was not sure of the new dosage since Dr. Everlena CooperJaffe advised her that he would increase it. [facility] [place] [phone]

## 2014-08-14 NOTE — Telephone Encounter (Signed)
cyclobenzaprine (FLEXERIL) 10 MG tablet # 90 with 1 refill

## 2014-10-21 IMAGING — US US OB DETAIL+14 WK
1 series · 12 of 28 positions shown · non-contrast
Comparison: none

[Series 1: us ob detail+14 wk · 0.18mm/px · 75 acquisitions, 12 frames shown]
[im 3/75]
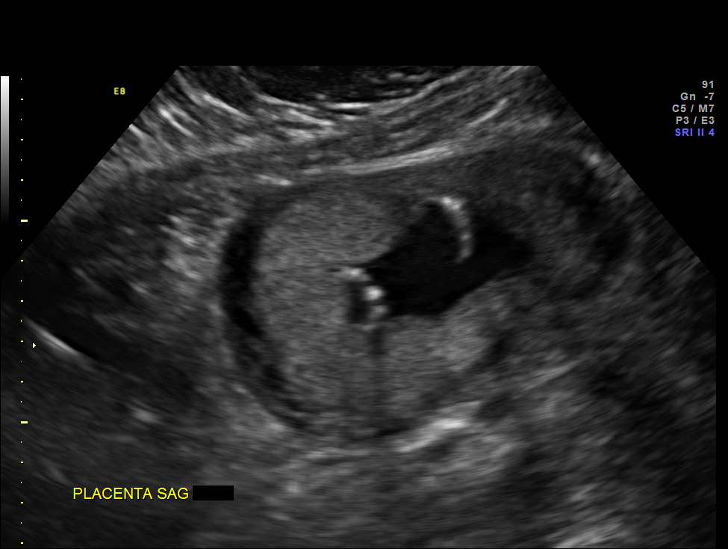
[im 9/75]
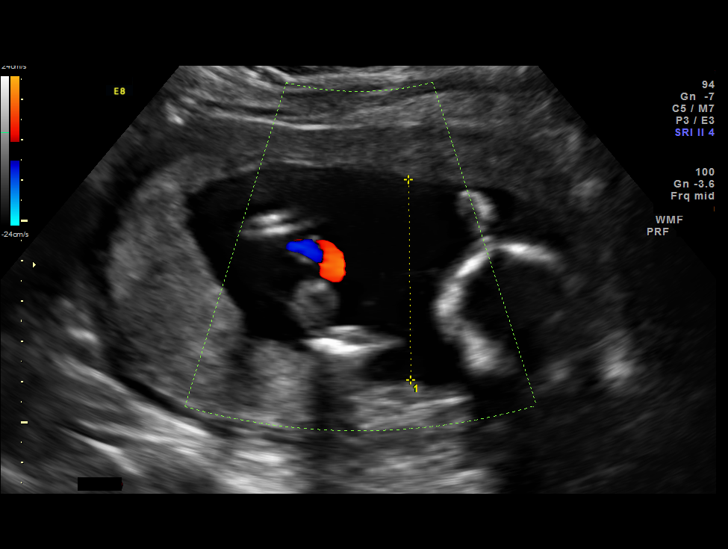
[im 14/75]
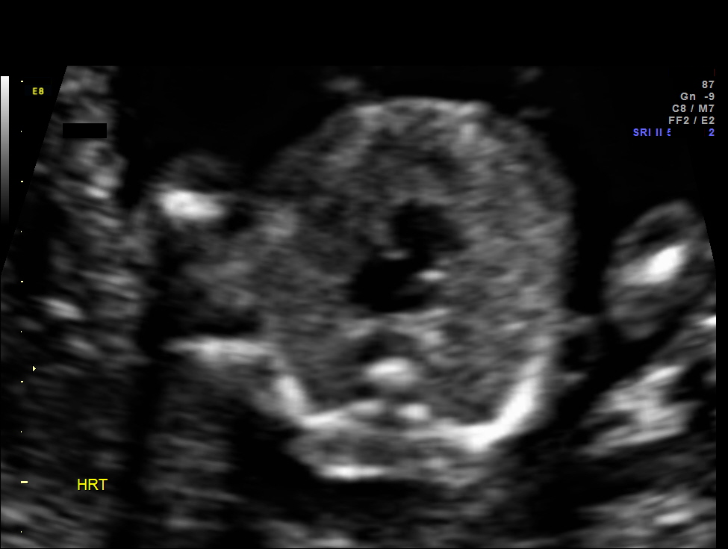
[im 22/75]
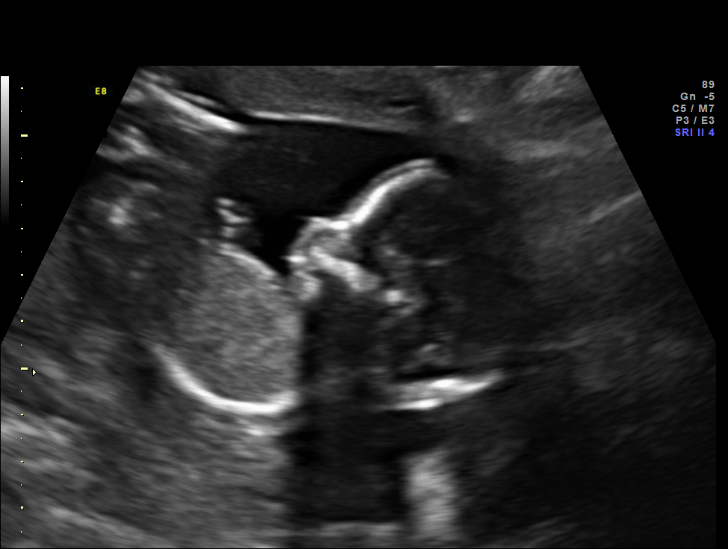
[im 28/75]
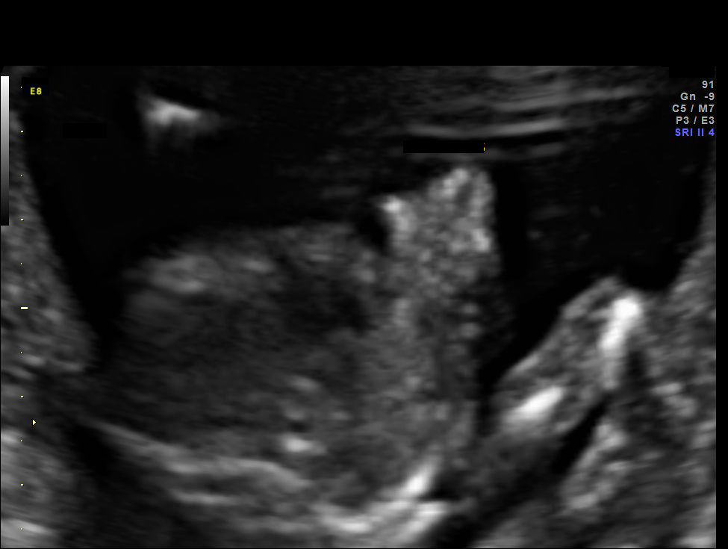
[im 33/75]
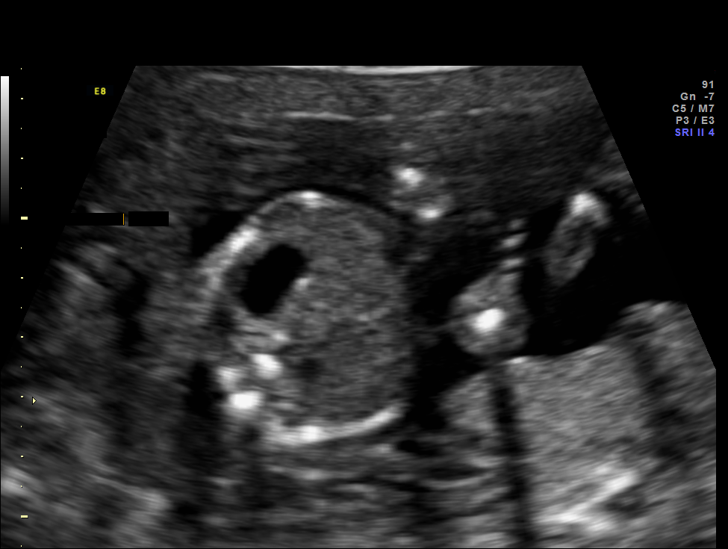
[im 42/75]
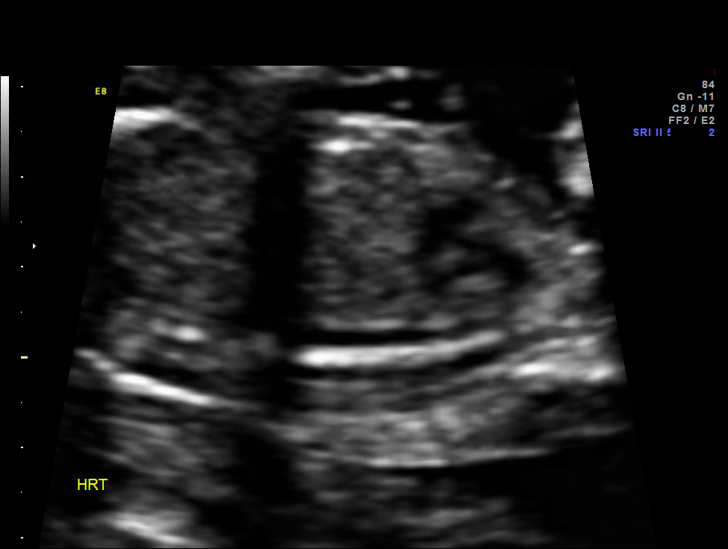
[im 47/75]
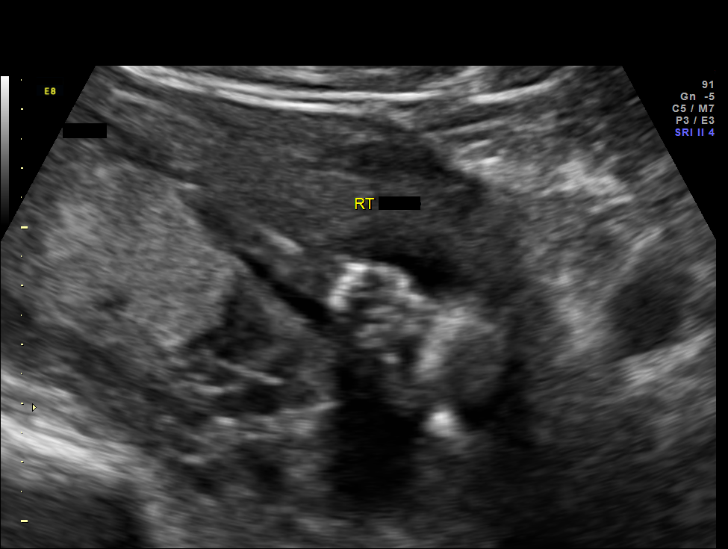
[im 53/75]
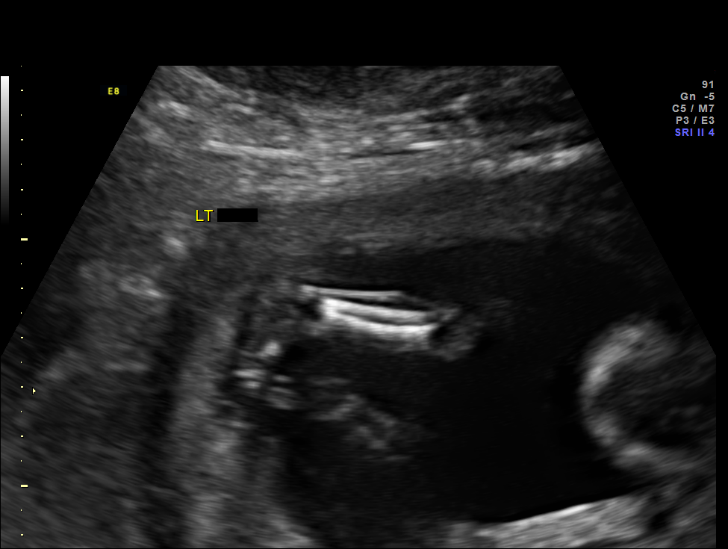
[im 61/75]
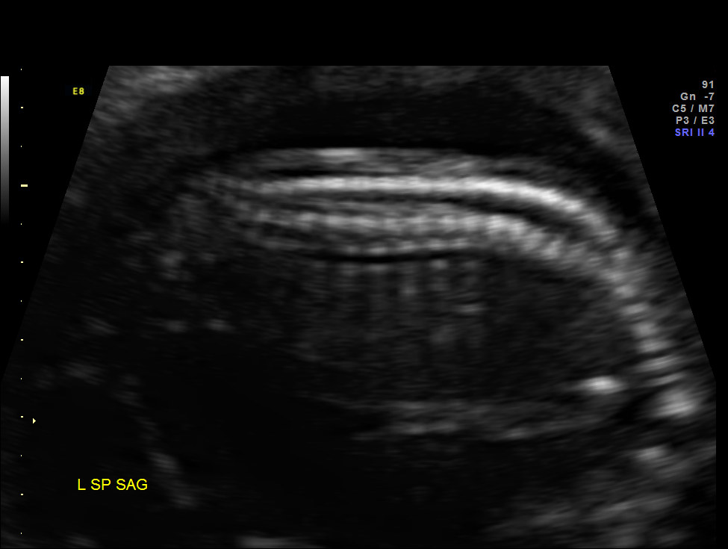
[im 66/75]
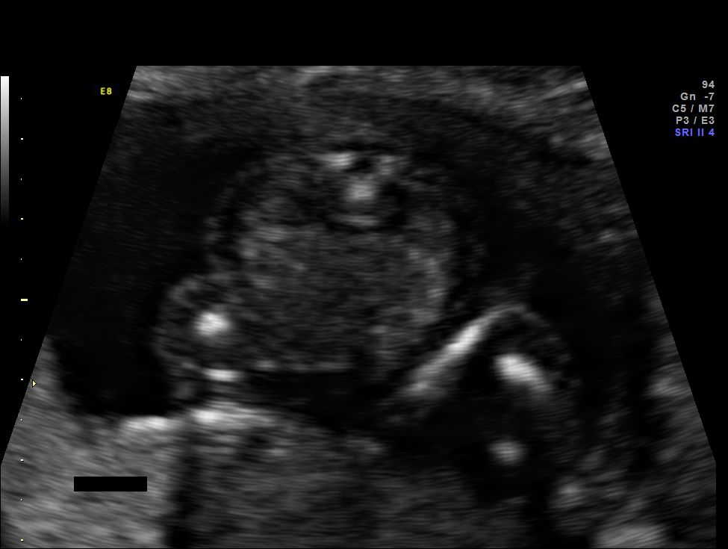
[im 72/75]
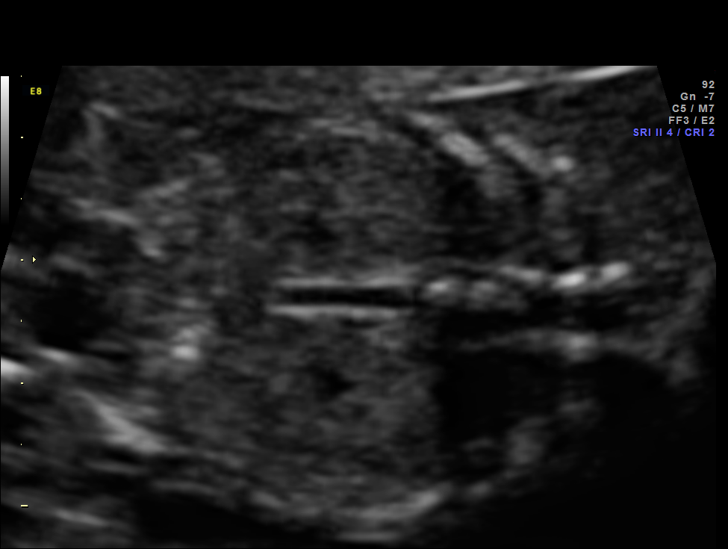

[12 of 28 positions shown; findings below may reference images not displayed]

OBSTETRICS REPORT
                      (Signed Final 12/02/2013 [DATE])

Service(s) Provided

 US OB DETAIL + 14 WK                                  76811.0
Indications

 Detailed fetal anatomic survey
 Medical complication of pregnancy (?SLE)
 Hypothyroidism on Synthroid
 Fibromyalgia
Fetal Evaluation

 Num Of Fetuses:    1
 Fetal Heart Rate:  147                          bpm
 Cardiac Activity:  Observed
 Presentation:      Cephalic
 Placenta:          Posterior Fundal, above
                    cervical os
 P. Cord            Visualized, central
 Insertion:

 Amniotic Fluid
 AFI FV:      Subjectively within normal limits
                                             Larg Pckt:     4.9  cm
Biometry

 BPD:     40.5  mm     G. Age:  18w 2d                CI:         77.7   70 - 86
 OFD:     52.1  mm                                    FL/HC:      17.8   15.8 -
                                                                         18
 HC:     149.7  mm     G. Age:  18w 0d       31  %    HC/AC:      1.15   1.07 -

 AC:     129.9  mm     G. Age:  18w 4d       55  %    FL/BPD:
 FL:      26.7  mm     G. Age:  18w 1d       39  %    FL/AC:      20.6   20 - 24
 HUM:     25.1  mm     G. Age:  17w 6d       41  %
 CER:     19.2  mm     G. Age:  18w 4d       60  %

 Est. FW:     234  gm      0 lb 8 oz     48  %
Gestational Age

 LMP:           20w 6d        Date:  07/09/13                 EDD:   04/15/14
 U/S Today:     18w 2d                                        EDD:   05/03/14
 Best:          18w 2d     Det. By:  Early Ultrasound         EDD:   05/03/14
                                     (09/16/13)
Anatomy

 Cranium:          Appears normal         Aortic Arch:      Appears normal
 Fetal Cavum:      Appears normal         Ductal Arch:      Appears normal
 Ventricles:       Appears normal         Diaphragm:        Appears normal
 Choroid Plexus:   Appears normal         Stomach:          Appears normal
 Cerebellum:       Appears normal         Abdomen:          Appears normal
 Posterior Fossa:  Appears normal         Abdominal Wall:   Appears nml (cord
                                                            insert, abd wall)
 Nuchal Fold:      Appears normal         Cord Vessels:     Appears normal (3
                                                            vessel cord)
 Face:             Appears normal         Kidneys:          Appear normal
                   (orbits and profile)
 Lips:             Appears normal         Bladder:          Appears normal
 Heart:            Appears normal         Spine:            Appears normal
                   (4CH, axis, and
                   situs)
 RVOT:             Appears normal         Lower             Appears normal
                                          Extremities:
 LVOT:             Appears normal         Upper             Appears normal
                                          Extremities:

 Other:  Fetus appears to be a female. Heels and 5th digit visualized. Nasal
         bone visualized.
Targeted Anatomy

 Fetal Central Nervous System
 Cisterna Magna:
Cervix Uterus Adnexa

 Cervical Length:    3.2      cm

 Cervix:       Normal appearance by transabdominal scan.

 Left Ovary:    Within normal limits.
 Right Ovary:   Not visualized.
 Adnexa:     No abnormality visualized.
Impression

 SIUP at 18+2 weeks
 Normal detailed fetal anatomy
 Markers of aneuploidy: none
 Normal amniotic fluid volume
 Measurements consistent with prior US

 Due to possible SLE diagnosis, we ordered SS-A and SS-B
 antibodies today.
Recommendations

 Probably will need serial Urumashvili for growth - has many + / -
 diagnoses?

## 2014-11-16 ENCOUNTER — Ambulatory Visit: Payer: BLUE CROSS/BLUE SHIELD | Admitting: Neurology

## 2014-11-25 ENCOUNTER — Ambulatory Visit: Payer: BLUE CROSS/BLUE SHIELD | Admitting: Neurology

## 2015-02-09 ENCOUNTER — Telehealth: Payer: Self-pay | Admitting: Neurology

## 2015-02-09 ENCOUNTER — Other Ambulatory Visit: Payer: Self-pay | Admitting: *Deleted

## 2015-02-09 MED ORDER — RIZATRIPTAN BENZOATE 10 MG PO TBDP: ORAL_TABLET | ORAL | Status: DC

## 2015-02-09 MED ORDER — CYCLOBENZAPRINE HCL 10 MG PO TABS: 10.0000 mg | ORAL_TABLET | Freq: Three times a day (TID) | ORAL | Status: DC | PRN

## 2015-02-09 NOTE — Telephone Encounter (Signed)
Pt called to schedule an appointment with Dr Everlena Cooper and her appointment is for 04/23/2015 but she said she needs refills of her migraine meds (could not remember the name) and a refill of Flexeril/Dawn CB# [phone]

## 2015-02-09 NOTE — Telephone Encounter (Signed)
Medications have been called in  For patient  Maxalt and Flexirel  She has scheduled a follow up apointment

## 2015-02-12 ENCOUNTER — Ambulatory Visit (INDEPENDENT_AMBULATORY_CARE_PROVIDER_SITE_OTHER): Payer: 59 | Admitting: Family Medicine

## 2015-02-12 ENCOUNTER — Encounter: Payer: Self-pay | Admitting: Family Medicine

## 2015-02-12 VITALS — BP 147/85 | HR 97 | Temp 99.0°F | Ht 64.0 in | Wt 226.4 lb

## 2015-02-12 DIAGNOSIS — D599 Acquired hemolytic anemia, unspecified: Secondary | ICD-10-CM | POA: Diagnosis not present

## 2015-02-12 DIAGNOSIS — D589 Hereditary hemolytic anemia, unspecified: Secondary | ICD-10-CM

## 2015-02-12 DIAGNOSIS — E038 Other specified hypothyroidism: Secondary | ICD-10-CM | POA: Diagnosis not present

## 2015-02-12 DIAGNOSIS — J45909 Unspecified asthma, uncomplicated: Secondary | ICD-10-CM | POA: Insufficient documentation

## 2015-02-12 DIAGNOSIS — J4520 Mild intermittent asthma, uncomplicated: Secondary | ICD-10-CM

## 2015-02-12 MED ORDER — FLUTICASONE FUROATE-VILANTEROL 100-25 MCG/INH IN AEPB: 1.0000 | INHALATION_SPRAY | Freq: Every morning | RESPIRATORY_TRACT | Status: DC

## 2015-02-12 MED ORDER — PREGABALIN 50 MG PO CAPS: ORAL_CAPSULE | ORAL | Status: DC

## 2015-02-12 NOTE — Progress Notes (Signed)
Subjective:  Patient ID: [name] [name], female    DOB: 1991/06/01  Age: 24 y.o. MRN: [id number]  CC: Medication Refill; Hypothyroidism; and Anemia   HPI [name] [name] presents for Patient presents for follow-up on  thyroid. She has a history of hypothyroidism for many years. It has been stable recently. Pt. denies any change in  voice, loss of hair, heat or cold intolerance. Energy level has been adequate to good. She denies constipation and diarrhea. No myxedema. Medication is as noted below. Verified that pt is taking it daily on an empty stomach. Well tolerated.  Patient is also here for follow-up on anemia. She has been treated for hemolytic anemia. Also achy from lupus and fibromyalgia. Menses are constant.Mild intermittent asthma flaring recently. Dyspneic.  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   She has past surgical history that includes No past surgeries.   Her family history includes Diabetes in her paternal grandfather; Fibromyalgia in her mother; Heart disease in her mother; Hypertension in her father and mother; Lupus in her mother.She reports that she quit smoking about 18 months ago. She has never used smokeless tobacco. She reports that she does not drink alcohol or use illicit drugs.  Outpatient Prescriptions Prior to Visit  Medication Sig Dispense Refill  . acetaminophen (TYLENOL) 325 MG tablet Take 650 mg by mouth every 6 (six) hours as needed for mild pain or headache.     . albuterol (PROVENTIL HFA;VENTOLIN HFA) 108 (90 BASE) MCG/ACT inhaler Inhale 2 puffs into the lungs every 6 (six) hours as needed for wheezing or shortness of breath. 1 Inhaler  11  . cyclobenzaprine (FLEXERIL) 10 MG tablet Take 1 tablet (10 mg total) by mouth 3 (three) times daily as needed for muscle spasms. 90 tablet 3  . Eluxadoline (VIBERZI) 100 MG TABS Take by mouth 2 (two) times daily.    Marland Kitchen etonogestrel (NEXPLANON) 68 MG IMPL implant 1 each by Subdermal route once.    . fluticasone (FLONASE) 50 MCG/ACT nasal spray Place 2 sprays into both nostrils daily. 16 g 6  . Fluticasone-Salmeterol (ADVAIR DISKUS) 250-50 MCG/DOSE AEPB Inhale 1 puff into the lungs 2 (two) times daily at 10 AM and 5 PM. 60 each 11  . hydroxychloroquine (PLAQUENIL) 200 MG tablet Take 200 mg by mouth 3 (three) times daily.   0  . rizatriptan (MAXALT-MLT) 10 MG disintegrating tablet Take 1 tablet (10 mg total) by mouth as needed for migraine. May repeat x1 in 2 hours if needed.  Do not exceed 2 tablets in 24 hrs 9 tablet 3  . sertraline (ZOLOFT) 25 MG tablet Take 3 tablets (75 mg total) by mouth daily. 90 tablet 2  . cyclobenzaprine (FLEXERIL) 10 MG tablet Take 10 mg by mouth 3 (three) times daily as needed for muscle spasms.    Marland Kitchen ibuprofen (ADVIL,MOTRIN) 600 MG tablet Take 1 tablet (600 mg total) by mouth every 6 (six) hours. 30 tablet 1  . benzonatate (TESSALON PERLES) 100 MG capsule Take 1-2 capsules (100-200 mg total) by mouth every 8 (eight) hours as needed for cough. 30 capsule 0  . DULoxetine (CYMBALTA) 30 MG capsule Take 1 cap daily for 7d, then 2 caps daily 60 capsule 0  . rizatriptan (MAXALT-MLT) 10 MG disintegrating tablet May repeat in 2 hours if needed  9 tablet 3  . sertraline (ZOLOFT) 50 MG tablet   1   No facility-administered medications prior to visit.    ROS Review of Systems  Constitutional: Negative for fever, chills, diaphoresis, appetite change, fatigue and unexpected weight change.  HENT: Negative for congestion, ear pain, hearing loss, postnasal drip, rhinorrhea, sneezing, sore throat and trouble swallowing.   Eyes: Negative for pain.  Respiratory: Negative for cough,  chest tightness and shortness of breath.   Cardiovascular: Negative for chest pain and palpitations.  Gastrointestinal: Negative for nausea, vomiting, abdominal pain, diarrhea and constipation.  Genitourinary: Negative for dysuria, frequency and menstrual problem.  Musculoskeletal: Negative for joint swelling and arthralgias.  Skin: Negative for rash.  Neurological: Negative for dizziness, weakness, numbness and headaches.  Psychiatric/Behavioral: Negative for dysphoric mood and agitation.    Objective:  BP 147/85 mmHg  Pulse 97  Temp(Src) 99 F (37.2 C) (Oral)  Ht 5' 4"  (1.626 m)  Wt 226 lb 6.4 oz (102.694 kg)  BMI 38.84 kg/m2  BP Readings from Last 3 Encounters:  02/12/15 147/85  08/14/14 128/76  08/12/14 126/70    Wt Readings from Last 3 Encounters:  02/12/15 226 lb 6.4 oz (102.694 kg)  08/14/14 206 lb (93.441 kg)  08/12/14 203 lb 9.6 oz (92.352 kg)     Physical Exam  Constitutional: She is oriented to person, place, and time. She appears well-developed and well-nourished. No distress.  HENT:  Head: Normocephalic and atraumatic.  Right Ear: External ear normal.  Left Ear: External ear normal.  Nose: Nose normal.  Mouth/Throat: Oropharynx is clear and moist.  Eyes: Conjunctivae and EOM are normal. Pupils are equal, round, and reactive to light.  Neck: Normal range of motion. Neck supple. No thyromegaly present.  Cardiovascular: Normal rate, regular rhythm and normal heart sounds.   No murmur heard. Pulmonary/Chest: Effort normal and breath sounds normal. No respiratory distress. She has no wheezes. She has no rales.  Abdominal: Soft. Bowel sounds are normal. She exhibits no distension. There is no tenderness.  Lymphadenopathy:    She has no cervical adenopathy.  Neurological: She is alert and oriented to person, place, and time. She has normal reflexes.  Skin: Skin is warm and dry.  Psychiatric: She has a normal mood and affect. Her behavior is normal. Judgment and  thought content normal.    No results found for: HGBA1C  Lab Results  Component Value Date   WBC 6.3 07/24/2014   HGB 13.4 07/24/2014   HCT 39.7 07/24/2014   PLT 366 07/24/2014   GLUCOSE 92 07/24/2014   CHOL 170 07/08/2013   TRIG 136* 07/08/2013   HDL 38* 07/08/2013   LDLCALC 105 07/08/2013   ALT 39 07/24/2014   AST 22 07/24/2014   NA 138 07/24/2014   K 4.0 07/24/2014   CL 101 04/20/2014   CREATININE 0.8 07/24/2014   BUN 7.4 07/24/2014   CO2 23 07/24/2014   TSH 3.530 08/15/2013    Mr Brain W Wo Contrast  07/13/2014   CLINICAL DATA:  Intractable chronic migraine without aura. Chronic pain syndrome. Numbness and tingling. Lupus and fibromyalgia. Rule out multiple sclerosis  EXAM: MRI HEAD WITHOUT AND WITH CONTRAST  TECHNIQUE: Multiplanar, multiecho pulse sequences of the brain and surrounding structures were obtained without and with intravenous contrast.  CONTRAST:  63m MULTIHANCE GADOBENATE DIMEGLUMINE 529 MG/ML IV SOLN  COMPARISON:  CT head 05/29/2013  FINDINGS: Ventricle size is normal. Negative for Chiari malformation. Pituitary is normal in size.  Negative for acute  or chronic ischemia  Negative for demyelinating disease. Brainstem is normal. White matter is normal.  Negative for hemorrhage or fluid collection.  Negative for mass or edema.  Paranasal sinuses are clear.  IMPRESSION: Normal   Electronically Signed   By: Franchot Gallo M.D.   On: 07/13/2014 20:20    Assessment & Plan:   Ayme was seen today for medication refill, hypothyroidism and anemia.  Diagnoses and all orders for this visit:  Other specified hypothyroidism Orders: -     CMP14+EGFR -     TSH -     T4, Free  Asthma, chronic, mild intermittent, uncomplicated Orders: -     PR BREATHING CAPACITY TEST -     Cancel: POCT CBC  Anemia, hemolytic Orders: -     Anemia Profile B -     Glucose 6 phosphate dehydrogenase  Other orders -     Discontinue: pregabalin (LYRICA) 50 MG capsule; One at  bedtime for 1 wk. Then 2 at bedtime for 1 week, then 3 for 1 week, then 4 -     Fluticasone Furoate-Vilanterol (BREO ELLIPTA) 100-25 MCG/INH AEPB; Inhale 1 Inhaler into the lungs every morning.   I have discontinued Ms. Backer's DULoxetine and benzonatate. I am also having her start on Fluticasone Furoate-Vilanterol. Additionally, I am having her maintain her acetaminophen, ibuprofen, Eluxadoline, fluticasone, hydroxychloroquine, etonogestrel, sertraline, rizatriptan, albuterol, Fluticasone-Salmeterol, and cyclobenzaprine.  Meds ordered this encounter  Medications  . DISCONTD: pregabalin (LYRICA) 50 MG capsule    Sig: One at bedtime for 1 wk. Then 2 at bedtime for 1 week, then 3 for 1 week, then 4    Dispense:  120 capsule    Refill:  1  . Fluticasone Furoate-Vilanterol (BREO ELLIPTA) 100-25 MCG/INH AEPB    Sig: Inhale 1 Inhaler into the lungs every morning.    Dispense:  60 each    Refill:  11     Follow-up: Return in about 1 month (around 03/15/2015).  Claretta Fraise, M.D.

## 2015-02-15 ENCOUNTER — Telehealth: Payer: Self-pay | Admitting: Family Medicine

## 2015-02-15 MED ORDER — PREGABALIN 50 MG PO CAPS: ORAL_CAPSULE | ORAL | Status: DC

## 2015-02-15 NOTE — Telephone Encounter (Signed)
Patient states that she never received her lyrica rx on Friday and I spoke with Lynden Ang she said rx was not given to patient. I printed new rx for [facility] to sign and patient aware that rx can be picked up.

## 2015-02-16 ENCOUNTER — Other Ambulatory Visit: Payer: Self-pay | Admitting: Family Medicine

## 2015-02-16 LAB — TSH: TSH: 7.1 u[IU]/mL — ABNORMAL HIGH (ref 0.450–4.500)

## 2015-02-16 LAB — CMP14+EGFR
ALBUMIN: 4.5 g/dL (ref 3.5–5.5)
ALT: 18 IU/L (ref 0–32)
AST: 13 IU/L (ref 0–40)
Albumin/Globulin Ratio: 1.6 (ref 1.1–2.5)
Alkaline Phosphatase: 77 IU/L (ref 39–117)
BUN / CREAT RATIO: 14 (ref 8–20)
BUN: 12 mg/dL (ref 6–20)
Bilirubin Total: <0.2 mg/dL (ref 0.0–1.2)
CALCIUM: 9.8 mg/dL (ref 8.7–10.2)
CO2: 24 mmol/L (ref 18–29)
Chloride: 99 mmol/L (ref 97–108)
Creatinine, Ser: 0.84 mg/dL (ref 0.57–1.00)
GFR calc Af Amer: 113 mL/min/{1.73_m2} (ref >59)
GFR, EST NON AFRICAN AMERICAN: 98 mL/min/{1.73_m2} (ref >59)
GLUCOSE: 111 mg/dL — AB (ref 65–99)
Globulin, Total: 2.8 g/dL (ref 1.5–4.5)
POTASSIUM: 4.3 mmol/L (ref 3.5–5.2)
SODIUM: 139 mmol/L (ref 134–144)
TOTAL PROTEIN: 7.3 g/dL (ref 6.0–8.5)

## 2015-02-16 LAB — ANEMIA PROFILE B
BASOS ABS: 0 10*3/uL (ref 0.0–0.2)
BASOS: 0 %
EOS (ABSOLUTE): 0.2 10*3/uL (ref 0.0–0.4)
Eos: 1 %
Ferritin: 108 ng/mL (ref 15–150)
Folate: 14.2 ng/mL (ref >3.0)
Hematocrit: 40.6 % (ref 34.0–46.6)
Hemoglobin: 13.3 g/dL (ref 11.1–15.9)
IMMATURE GRANS (ABS): 0 10*3/uL (ref 0.0–0.1)
Immature Granulocytes: 0 %
Iron Saturation: 10 % — ABNORMAL LOW (ref 15–55)
Iron: 35 ug/dL (ref 27–159)
LYMPHS ABS: 2.8 10*3/uL (ref 0.7–3.1)
Lymphs: 26 %
MCH: 27.8 pg (ref 26.6–33.0)
MCHC: 32.8 g/dL (ref 31.5–35.7)
MCV: 85 fL (ref 79–97)
MONOCYTES: 5 %
MONOS ABS: 0.5 10*3/uL (ref 0.1–0.9)
NEUTROS PCT: 68 %
Neutrophils Absolute: 7.1 10*3/uL — ABNORMAL HIGH (ref 1.4–7.0)
PLATELETS: 421 10*3/uL — AB (ref 150–379)
RBC: 4.78 x10E6/uL (ref 3.77–5.28)
RDW: 13.7 % (ref 12.3–15.4)
Retic Ct Pct: 1.7 % (ref 0.6–2.6)
Total Iron Binding Capacity: 368 ug/dL (ref 250–450)
UIBC: 333 ug/dL (ref 131–425)
Vitamin B-12: 436 pg/mL (ref 211–946)
WBC: 10.5 10*3/uL (ref 3.4–10.8)

## 2015-02-16 LAB — T4, FREE: FREE T4: 0.96 ng/dL (ref 0.82–1.77)

## 2015-02-16 LAB — GLUCOSE 6 PHOSPHATE DEHYDROGENASE: G-6-PD, Quant: 10.9 U/g{Hb} (ref 4.6–13.5)

## 2015-02-16 MED ORDER — LEVOTHYROXINE SODIUM 50 MCG PO TABS: 50.0000 ug | ORAL_TABLET | Freq: Every day | ORAL | Status: DC

## 2015-02-16 MED ORDER — FERROUS FUMARATE 324 (106 FE) MG PO TABS
324.0000 mg | ORAL_TABLET | Freq: Two times a day (BID) | ORAL | Status: DC
Start: 2015-02-16 — End: 2016-06-09

## 2015-02-24 ENCOUNTER — Telehealth: Payer: Self-pay | Admitting: Neurology

## 2015-02-24 ENCOUNTER — Other Ambulatory Visit: Payer: Self-pay | Admitting: *Deleted

## 2015-02-24 MED ORDER — SERTRALINE HCL 25 MG PO TABS: 25.0000 mg | ORAL_TABLET | Freq: Three times a day (TID) | ORAL | Status: DC

## 2015-02-24 NOTE — Telephone Encounter (Signed)
I left a message for patient to return my call. 

## 2015-02-24 NOTE — Telephone Encounter (Signed)
Pt called and said that insurance does not cover her medication Maxalt/Dawn CB# [phone]

## 2015-02-26 ENCOUNTER — Telehealth: Payer: Self-pay

## 2015-02-26 MED ORDER — MILNACIPRAN HCL 12.5 & 25 & 50 MG PO MISC: ORAL | Status: DC

## 2015-02-26 NOTE — Telephone Encounter (Signed)
Insurance denied request for prior authorization for Lyrica due to having to have a DX of seizure disorder or neuropathic pain due to spinal cord injury or patient has a HX of failure to two of these meds:  Gabapentin, Savella, Effexor or Cymbalta

## 2015-02-26 NOTE — Telephone Encounter (Signed)
Savella prescription sent to pharmacy. Please let the patient know that we could not get Lyrica approved but this is an excellent medicine as well.

## 2015-03-15 ENCOUNTER — Ambulatory Visit: Payer: 59 | Admitting: Family Medicine

## 2015-03-29 ENCOUNTER — Other Ambulatory Visit (INDEPENDENT_AMBULATORY_CARE_PROVIDER_SITE_OTHER): Payer: 59

## 2015-03-29 DIAGNOSIS — E039 Hypothyroidism, unspecified: Secondary | ICD-10-CM

## 2015-03-29 DIAGNOSIS — R635 Abnormal weight gain: Secondary | ICD-10-CM

## 2015-03-30 ENCOUNTER — Emergency Department (HOSPITAL_COMMUNITY)
Admission: EM | Admit: 2015-03-30 | Discharge: 2015-03-30 | Disposition: A | Discharge status: Home / Self Care | Payer: 59 | Attending: Emergency Medicine | Admitting: Emergency Medicine

## 2015-03-30 ENCOUNTER — Ambulatory Visit (HOSPITAL_COMMUNITY): Admission: RE | Admit: 2015-03-30 | Payer: 59 | Source: Ambulatory Visit

## 2015-03-30 ENCOUNTER — Emergency Department (HOSPITAL_COMMUNITY): Payer: 59

## 2015-03-30 ENCOUNTER — Encounter (HOSPITAL_COMMUNITY): Payer: Self-pay | Admitting: Emergency Medicine

## 2015-03-30 DIAGNOSIS — Z87891 Personal history of nicotine dependence: Secondary | ICD-10-CM | POA: Diagnosis not present

## 2015-03-30 DIAGNOSIS — D591 Other autoimmune hemolytic anemias: Secondary | ICD-10-CM | POA: Insufficient documentation

## 2015-03-30 DIAGNOSIS — F329 Major depressive disorder, single episode, unspecified: Secondary | ICD-10-CM | POA: Insufficient documentation

## 2015-03-30 DIAGNOSIS — G43909 Migraine, unspecified, not intractable, without status migrainosus: Secondary | ICD-10-CM | POA: Insufficient documentation

## 2015-03-30 DIAGNOSIS — F419 Anxiety disorder, unspecified: Secondary | ICD-10-CM | POA: Insufficient documentation

## 2015-03-30 DIAGNOSIS — I10 Essential (primary) hypertension: Secondary | ICD-10-CM | POA: Diagnosis not present

## 2015-03-30 DIAGNOSIS — Z3202 Encounter for pregnancy test, result negative: Secondary | ICD-10-CM | POA: Insufficient documentation

## 2015-03-30 DIAGNOSIS — M797 Fibromyalgia: Secondary | ICD-10-CM | POA: Insufficient documentation

## 2015-03-30 DIAGNOSIS — E669 Obesity, unspecified: Secondary | ICD-10-CM | POA: Diagnosis not present

## 2015-03-30 DIAGNOSIS — Z8719 Personal history of other diseases of the digestive system: Secondary | ICD-10-CM | POA: Insufficient documentation

## 2015-03-30 DIAGNOSIS — E119 Type 2 diabetes mellitus without complications: Secondary | ICD-10-CM | POA: Diagnosis not present

## 2015-03-30 DIAGNOSIS — Z7951 Long term (current) use of inhaled steroids: Secondary | ICD-10-CM | POA: Insufficient documentation

## 2015-03-30 DIAGNOSIS — R109 Unspecified abdominal pain: Secondary | ICD-10-CM | POA: Diagnosis present

## 2015-03-30 DIAGNOSIS — E039 Hypothyroidism, unspecified: Secondary | ICD-10-CM | POA: Diagnosis not present

## 2015-03-30 DIAGNOSIS — Z79899 Other long term (current) drug therapy: Secondary | ICD-10-CM | POA: Diagnosis not present

## 2015-03-30 LAB — COMPREHENSIVE METABOLIC PANEL
ALBUMIN: 4 g/dL (ref 3.5–5.0)
ALK PHOS: 69 U/L (ref 38–126)
ALT: 31 U/L (ref 14–54)
AST: 38 U/L (ref 15–41)
Anion gap: 8 (ref 5–15)
BUN: 10 mg/dL (ref 6–20)
CHLORIDE: 105 mmol/L (ref 101–111)
CO2: 22 mmol/L (ref 22–32)
CREATININE: 0.84 mg/dL (ref 0.44–1.00)
Calcium: 8.6 mg/dL — ABNORMAL LOW (ref 8.9–10.3)
GFR calc non Af Amer: >60 mL/min (ref >60)
GLUCOSE: 114 mg/dL — AB (ref 65–99)
Potassium: 5.1 mmol/L (ref 3.5–5.1)
SODIUM: 135 mmol/L (ref 135–145)
Total Bilirubin: 1 mg/dL (ref 0.3–1.2)
Total Protein: 7 g/dL (ref 6.5–8.1)

## 2015-03-30 LAB — CBC
HCT: 43.2 % (ref 36.0–46.0)
Hemoglobin: 14.1 g/dL (ref 12.0–15.0)
MCH: 27.9 pg (ref 26.0–34.0)
MCHC: 32.6 g/dL (ref 30.0–36.0)
MCV: 85.5 fL (ref 78.0–100.0)
PLATELETS: 335 10*3/uL (ref 150–400)
RBC: 5.05 MIL/uL (ref 3.87–5.11)
RDW: 14.7 % (ref 11.5–15.5)
WBC: 9.7 10*3/uL (ref 4.0–10.5)

## 2015-03-30 LAB — ANEMIA PROFILE B
BASOS ABS: 0.1 10*3/uL (ref 0.0–0.2)
Basos: 1 %
EOS (ABSOLUTE): 0.4 10*3/uL (ref 0.0–0.4)
EOS: 4 %
FOLATE: 18.1 ng/mL (ref >3.0)
Ferritin: 85 ng/mL (ref 15–150)
Hematocrit: 41.6 % (ref 34.0–46.6)
Hemoglobin: 13.4 g/dL (ref 11.1–15.9)
IRON SATURATION: 17 % (ref 15–55)
IRON: 55 ug/dL (ref 27–159)
Immature Grans (Abs): 0 10*3/uL (ref 0.0–0.1)
Immature Granulocytes: 0 %
LYMPHS ABS: 3.1 10*3/uL (ref 0.7–3.1)
Lymphs: 34 %
MCH: 27.7 pg (ref 26.6–33.0)
MCHC: 32.2 g/dL (ref 31.5–35.7)
MCV: 86 fL (ref 79–97)
MONOS ABS: 0.7 10*3/uL (ref 0.1–0.9)
Monocytes: 7 %
Neutrophils Absolute: 4.9 10*3/uL (ref 1.4–7.0)
Neutrophils: 54 %
PLATELETS: 405 10*3/uL — AB (ref 150–379)
RBC: 4.84 x10E6/uL (ref 3.77–5.28)
RDW: 14.4 % (ref 12.3–15.4)
Retic Ct Pct: 1.7 % (ref 0.6–2.6)
TIBC: 331 ug/dL (ref 250–450)
UIBC: 276 ug/dL (ref 131–425)
Vitamin B-12: 424 pg/mL (ref 211–946)
WBC: 9.1 10*3/uL (ref 3.4–10.8)

## 2015-03-30 LAB — URINALYSIS, ROUTINE W REFLEX MICROSCOPIC
Bilirubin Urine: NEGATIVE
GLUCOSE, UA: NEGATIVE mg/dL
KETONES UR: NEGATIVE mg/dL
LEUKOCYTES UA: NEGATIVE
Nitrite: NEGATIVE
PH: 5.5 (ref 5.0–8.0)
Protein, ur: NEGATIVE mg/dL
Specific Gravity, Urine: 1.012 (ref 1.005–1.030)
Urobilinogen, UA: 0.2 mg/dL (ref 0.0–1.0)

## 2015-03-30 LAB — I-STAT BETA HCG BLOOD, ED (MC, WL, AP ONLY): I-stat hCG, quantitative: <5 m[IU]/mL (ref <5)

## 2015-03-30 LAB — LIPASE, BLOOD: LIPASE: 31 U/L (ref 22–51)

## 2015-03-30 LAB — THYROID PANEL WITH TSH
Free Thyroxine Index: 2 (ref 1.2–4.9)
T3 Uptake Ratio: 32 % (ref 24–39)
T4, Total: 6.3 ug/dL (ref 4.5–12.0)
TSH: 5.19 u[IU]/mL — ABNORMAL HIGH (ref 0.450–4.500)

## 2015-03-30 LAB — URINE MICROSCOPIC-ADD ON

## 2015-03-30 MED ORDER — FENTANYL CITRATE (PF) 100 MCG/2ML IJ SOLN
50.0000 ug | Freq: Once | INTRAMUSCULAR | Status: DC
  Filled 2015-03-30: qty 2

## 2015-03-30 MED ORDER — OXYCODONE-ACETAMINOPHEN 5-325 MG PO TABS: 1.0000 | ORAL_TABLET | Freq: Four times a day (QID) | ORAL | Status: DC | PRN

## 2015-03-30 MED ORDER — SODIUM CHLORIDE 0.9 % IV BOLUS (SEPSIS)
1000.0000 mL | Freq: Once | INTRAVENOUS | Status: AC
  Administered 2015-03-30: 1000 mL via INTRAVENOUS

## 2015-03-30 MED ORDER — IOHEXOL 300 MG/ML  SOLN
25.0000 mL | Freq: Once | INTRAMUSCULAR | Status: AC | PRN
  Administered 2015-03-30: 25 mL via ORAL

## 2015-03-30 MED ORDER — PROMETHAZINE HCL 25 MG PO TABS: 25.0000 mg | ORAL_TABLET | Freq: Four times a day (QID) | ORAL | Status: DC | PRN

## 2015-03-30 MED ORDER — ONDANSETRON HCL 4 MG/2ML IJ SOLN
4.0000 mg | Freq: Once | INTRAMUSCULAR | Status: AC
  Administered 2015-03-30: 4 mg via INTRAVENOUS
  Filled 2015-03-30: qty 2

## 2015-03-30 NOTE — ED Notes (Signed)
Pt transported to xray. NAD. VSS.

## 2015-03-30 NOTE — Discharge Instructions (Signed)
Tests showed no acute findings. Medication for pain and nausea. Follow-up your primary care doctor.

## 2015-03-30 NOTE — ED Notes (Signed)
Pt having right sided lower quadrant pain that woke her up this morning constant and non radiating. Has appendix and hx of IBS. Reports gas and diarr.  Reports may feels a little better after passing gas and feels like has have BM. Denies nausea.

## 2015-03-30 NOTE — ED Provider Notes (Signed)
CSN: [id number]     Arrival date & time 03/30/15  0707 History   First MD Initiated Contact with Patient 03/30/15 [id number]     Chief Complaint  Patient presents with  . Abdominal Pain     (Consider location/radiation/quality/duration/timing/severity/associated sxs/prior Treatment) HPI.... Right lower quadrant pain since 5 AM with associated nausea, vomiting, and diarrhea. Patient has menstrual bleeding every other day which is her norm. No dysuria, hematuria, fever, sweats, chills. She has multiple health problems well documented in past medical history. Pain is nonradiating. Nothing makes symptoms better or worse.  Past Medical History  Diagnosis Date  . Hypothyroidism   . Anxiety   . Vitamin D deficiency   . Depression   . 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 Fibromyalgia   . Lupus   . Warm antibody hemolytic anemia   . Diabetes mellitus without complication   . Hypertension   . GERD (gastroesophageal reflux disease)   . IBS (irritable bowel syndrome)   . Headache   . Fibromyalgia   . Hx of migraines    Past Surgical History  Procedure Laterality Date  . No past surgeries     Family History  Problem Relation Age of Onset  . Fibromyalgia Mother   . Heart disease Mother   . Hypertension Mother   . Lupus Mother   . Hypertension Father   . Diabetes Paternal Grandfather    Social History  Substance Use Topics  . Smoking status: Former Smoker    Quit date: 07/29/2013  . Smokeless tobacco: Never Used  . Alcohol Use: No   OB History    Gravida Para Term Preterm AB TAB SAB Ectopic Multiple Living   3 1 1  2  2   1      Review of Systems  All other systems reviewed and are negative.     Allergies  Celexa; Ranitidine; and Topamax  Home Medications   Prior to Admission medications   Medication Sig Start Date End Date Taking? Authorizing Provider  albuterol (PROVENTIL HFA;VENTOLIN HFA) 108 (90 BASE) MCG/ACT inhaler Inhale 2 puffs into the lungs every 6 (six)  hours as needed for wheezing or shortness of breath. 08/14/14  Yes Deatra Canter, FNP  cyclobenzaprine (FLEXERIL) 10 MG tablet Take 1 tablet (10 mg total) by mouth 3 (three) times daily as needed for muscle spasms. 02/09/15  Yes Adam Mliss Fritz, DO  etonogestrel (NEXPLANON) 68 MG IMPL implant 1 each by Subdermal route once.   Yes Historical Provider, MD  Ferrous Fumarate 324 (106 FE) MG TABS Take 324 mg by mouth 2 (two) times daily. 02/16/15  Yes Mechele Claude, MD  fluticasone (FLONASE) 50 MCG/ACT nasal spray Place 2 sprays into both nostrils daily. 08/12/14  Yes Beau Fanny, FNP  Fluticasone Furoate-Vilanterol (BREO ELLIPTA) 100-25 MCG/INH AEPB Inhale 1 Inhaler into the lungs every morning. 02/12/15  Yes Mechele Claude, MD  hydroxychloroquine (PLAQUENIL) 200 MG tablet Take 200 mg by mouth 3 (three) times daily.  08/11/14  Yes Historical Provider, MD  ibuprofen (ADVIL,MOTRIN) 600 MG tablet Take 1 tablet (600 mg total) by mouth every 6 (six) hours. 04/28/14  Yes Julio Sicks, NP  levothyroxine (SYNTHROID, LEVOTHROID) 50 MCG tablet Take 1 tablet (50 mcg total) by mouth daily. 02/16/15  Yes Mechele Claude, MD  Prenatal Vit-Fe Fumarate-FA (MULTIVITAMIN-PRENATAL) 27-0.8 MG TABS tablet Take 1 tablet by mouth daily at 12 noon.   Yes Historical Provider, MD  rizatriptan (MAXALT-MLT) 10 MG disintegrating tablet Take 1 tablet (10 mg  total) by mouth as needed for migraine. May repeat x1 in 2 hours if needed.  Do not exceed 2 tablets in 24 hrs 08/12/14  Yes Adam Mliss Fritz, DO  sertraline (ZOLOFT) 25 MG tablet Take 3 tablets (75 mg total) by mouth daily. 08/12/14  Yes Adam Mliss Fritz, DO  Fluticasone-Salmeterol (ADVAIR DISKUS) 250-50 MCG/DOSE AEPB Inhale 1 puff into the lungs 2 (two) times daily at 10 AM and 5 PM. Patient not taking: Reported on 03/30/2015 08/14/14   Deatra Canter, FNP  Milnacipran HCl (SAVELLA TITRATION PACK) 12.5 & 25 & 50 MG MISC Titrate to a full dose per package directions. Patient not taking: Reported on  03/30/2015 02/26/15   Mechele Claude, MD  oxyCODONE-acetaminophen (PERCOCET/ROXICET) 5-325 MG per tablet Take 1-2 tablets by mouth every 6 (six) hours as needed. 03/30/15   Donnetta Hutching, MD  pregabalin (LYRICA) 50 MG capsule One at bedtime for 1 wk. Then 2 at bedtime for 1 week, then 3 for 1 week, then 4 Patient not taking: Reported on 03/30/2015 02/15/15   Mechele Claude, MD  promethazine (PHENERGAN) 25 MG tablet Take 1 tablet (25 mg total) by mouth every 6 (six) hours as needed for nausea or vomiting. 03/30/15   Donnetta Hutching, MD  sertraline (ZOLOFT) 25 MG tablet Take 1 tablet (25 mg total) by mouth 3 (three) times daily with meals. Patient not taking: Reported on 03/30/2015 02/24/15   Drema Dallas, DO   BP 121/58 mmHg  Pulse 83  Temp(Src) 98 F (36.7 C) (Oral)  Resp 12  SpO2 100% Physical Exam  Constitutional: She is oriented to person, place, and time. She appears well-developed and well-nourished.  Obese, NAD  HENT:  Head: Normocephalic and atraumatic.  Eyes: Conjunctivae and EOM are normal. Pupils are equal, round, and reactive to light.  Neck: Normal range of motion. Neck supple.  Cardiovascular: Normal rate and regular rhythm.   Pulmonary/Chest: Effort normal and breath sounds normal.  Abdominal: Soft. Bowel sounds are normal.  Minimal right midabdominal tenderness  Musculoskeletal: Normal range of motion.  Neurological: She is alert and oriented to person, place, and time.  Skin: Skin is warm and dry.  Psychiatric: She has a normal mood and affect. Her behavior is normal.  Nursing note and vitals reviewed.   ED Course  Procedures (including critical care time) Labs Review Labs Reviewed  COMPREHENSIVE METABOLIC PANEL - Abnormal; Notable for the following:    Glucose, Bld 114 (*)    Calcium 8.6 (*)    All other components within normal limits  URINALYSIS, ROUTINE W REFLEX MICROSCOPIC (NOT AT [facility]) - Abnormal; Notable for the following:    Hgb urine dipstick SMALL (*)    All other  components within normal limits  LIPASE, BLOOD  CBC  URINE MICROSCOPIC-ADD ON  I-STAT BETA HCG BLOOD, ED ([facility], [facility], [facility] ONLY)    Imaging Review Dg Abd Acute W/chest  03/30/2015   CLINICAL DATA:  Right lower abdominal pain with nausea, vomiting and diarrhea of unknown duration. History of irritable bowel syndrome.  EXAM: DG ABDOMEN ACUTE W/ 1V CHEST  COMPARISON:  Upper GI series done 12/16/2008. Abdominal CT 11/26/2008.  FINDINGS: The heart size and mediastinal contours are normal. The lungs are clear. There is no pleural effusion or pneumothorax. No acute osseous findings are identified.  The bowel gas pattern is normal. There is no free intraperitoneal air or suspicious abdominal calcification. No acute osseous findings demonstrated. Transitional lumbosacral anatomy noted.  IMPRESSION: No active cardiopulmonary or  abdominal process identified.   Electronically Signed   By: Carey Bullocks M.D.   On: 03/30/2015 08:28   I have personally reviewed and evaluated these images and lab results as part of my medical decision-making.   EKG Interpretation None      MDM   Final diagnoses:  Abdominal pain, unspecified abdominal location    No acute abdomen. Patient is non-tender over McBurney's point. White count normal. CT scan offered to patient, but she refused. She understands the possibility of appendicitis. Patient discharged with prescription for Percocet and Phenergan 25 mg    Donnetta Hutching, MD 03/30/15 1505

## 2015-03-30 NOTE — ED Notes (Signed)
CT notified that pt has finished her contrast.  

## 2015-03-31 ENCOUNTER — Ambulatory Visit (INDEPENDENT_AMBULATORY_CARE_PROVIDER_SITE_OTHER): Payer: 59 | Admitting: Family Medicine

## 2015-03-31 ENCOUNTER — Encounter: Payer: Self-pay | Admitting: Family Medicine

## 2015-03-31 VITALS — BP 117/70 | HR 89 | Temp 88.5°F | Ht 64.0 in | Wt 226.8 lb

## 2015-03-31 DIAGNOSIS — R1031 Right lower quadrant pain: Secondary | ICD-10-CM | POA: Diagnosis not present

## 2015-03-31 DIAGNOSIS — E039 Hypothyroidism, unspecified: Secondary | ICD-10-CM

## 2015-03-31 DIAGNOSIS — M329 Systemic lupus erythematosus, unspecified: Secondary | ICD-10-CM | POA: Diagnosis not present

## 2015-03-31 MED ORDER — LEVOTHYROXINE SODIUM 75 MCG PO TABS: 75.0000 ug | ORAL_TABLET | Freq: Every day | ORAL | Status: DC

## 2015-03-31 MED ORDER — GABAPENTIN 100 MG PO CAPS: ORAL_CAPSULE | ORAL | Status: DC

## 2015-03-31 NOTE — Progress Notes (Signed)
Subjective:  Patient ID: [name] [name], female    DOB: Oct 25, 1990  Age: 24 y.o. MRN: [id number]  CC: Hypothyroidism and RLQ pain   HPI [name] [name] presents for sudden onset yesterday of right lower quadrant pain. She was awakened by the pain at 5 AM. She proceeded to the current emergency department. The pain gradually declined through several hour long visit. She had multiple tests but left before CT could be performed because the pain was better and she had been there over 8 hours. She was told to follow-up here. At this time the pain has diminished to approximately 2/10 however at onset it was 9/10 and persisted for approximately 2 hours. It was a sharp right lower quadrant pain. There was concern for appendicitis. She did have associated nausea. She denies vomiting. Review of the ER record reveals that she had been having some frequent menstrual bleeding. Patient states that she has a progesterone implant for birth control. She is concerned that it is causing significant weight gain as well as irregular menstrual bleeding. The pain has not been migratory. Review of ER record does not show leukocytosis. Of note is that her pain has been intermittent in the past and she has had cramping and bloating. She did have a negative evaluation by GI for gluten intolerance. This was based on lab test results.  Patient presents for follow-up on  thyroid. She has a history of hypothyroidism for many years. It has been stable recently. Pt. denies any change in  voice, loss of hair, heat or cold intolerance. Energy level has been adequate to good. She denies constipation and diarrhea. No myxedema. Medication is as noted below. Verified that pt is taking it daily on an empty stomach. Well tolerated.  Patient could not afford the Lyrica. Insurance would not cover. She has tried Cymbalta in the past and did not tolerate that. Insurance has suggested a step that it requiring an agent such as gabapentin. This is  meant to reduce the chronic pain she is having related to fibromyalgia and lupus.  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   She has past surgical history that includes No past surgeries.   Her family history includes Diabetes in her paternal grandfather; Fibromyalgia in her mother; Heart disease in her mother; Hypertension in her father and mother; Lupus in her mother.She reports that she quit smoking about 20 months ago. She has never used smokeless tobacco. She reports that she does not drink alcohol or use illicit drugs.  Outpatient Prescriptions Prior to Visit  Medication Sig Dispense Refill  . albuterol (PROVENTIL HFA;VENTOLIN HFA) 108 (90 BASE) MCG/ACT inhaler Inhale 2 puffs into the lungs every 6 (six) hours as needed for wheezing or shortness of breath. 1 Inhaler 11  . cyclobenzaprine (FLEXERIL) 10 MG tablet Take 1 tablet (10 mg total) by mouth 3 (three) times daily as needed for muscle spasms. 90 tablet 3  . etonogestrel (NEXPLANON) 68 MG IMPL implant 1 each by Subdermal route once.    . Ferrous Fumarate 324 (106 FE) MG TABS Take 324 mg by mouth 2 (two) times daily. 60 tablet 5  . fluticasone (FLONASE) 50 MCG/ACT nasal spray Place 2 sprays into both nostrils daily. 16 g 6  . Fluticasone Furoate-Vilanterol (BREO ELLIPTA) 100-25 MCG/INH AEPB Inhale 1 Inhaler  into the lungs every morning. 60 each 11  . hydroxychloroquine (PLAQUENIL) 200 MG tablet Take 200 mg by mouth 3 (three) times daily.   0  . ibuprofen (ADVIL,MOTRIN) 600 MG tablet Take 1 tablet (600 mg total) by mouth every 6 (six) hours. 30 tablet 1  . oxyCODONE-acetaminophen (PERCOCET/ROXICET) 5-325 MG per tablet  Take 1-2 tablets by mouth every 6 (six) hours as needed. 16 tablet 0  . Prenatal Vit-Fe Fumarate-FA (MULTIVITAMIN-PRENATAL) 27-0.8 MG TABS tablet Take 1 tablet by mouth daily at 12 noon.    . promethazine (PHENERGAN) 25 MG tablet Take 1 tablet (25 mg total) by mouth every 6 (six) hours as needed for nausea or vomiting. 10 tablet 0  . rizatriptan (MAXALT-MLT) 10 MG disintegrating tablet Take 1 tablet (10 mg total) by mouth as needed for migraine. May repeat x1 in 2 hours if needed.  Do not exceed 2 tablets in 24 hrs 9 tablet 3  . sertraline (ZOLOFT) 25 MG tablet Take 3 tablets (75 mg total) by mouth daily. 90 tablet 2  . levothyroxine (SYNTHROID, LEVOTHROID) 50 MCG tablet Take 1 tablet (50 mcg total) by mouth daily. 30 tablet 1  . Fluticasone-Salmeterol (ADVAIR DISKUS) 250-50 MCG/DOSE AEPB Inhale 1 puff into the lungs 2 (two) times daily at 10 AM and 5 PM. (Patient not taking: Reported on 03/30/2015) 60 each 11  . Milnacipran HCl (SAVELLA TITRATION PACK) 12.5 & 25 & 50 MG MISC Titrate to a full dose per package directions. (Patient not taking: Reported on 03/30/2015) 1 each 0  . pregabalin (LYRICA) 50 MG capsule One at bedtime for 1 wk. Then 2 at bedtime for 1 week, then 3 for 1 week, then 4 (Patient not taking: Reported on 03/30/2015) 120 capsule 1  . sertraline (ZOLOFT) 25 MG tablet Take 1 tablet (25 mg total) by mouth 3 (three) times daily with meals. (Patient not taking: Reported on 03/30/2015) 90 tablet 3   No facility-administered medications prior to visit.    ROS Review of Systems  Constitutional: Positive for appetite change. Negative for fever, chills (decreased), diaphoresis, fatigue and unexpected weight change.  HENT: Negative for congestion, ear pain, hearing loss, postnasal drip, rhinorrhea, sneezing, sore throat and trouble swallowing.   Eyes: Negative for pain.  Respiratory: Negative for cough, chest tightness and shortness of breath.   Cardiovascular: Negative for chest pain and  palpitations.  Gastrointestinal: Positive for nausea, abdominal pain and abdominal distention. Negative for diarrhea, constipation and blood in stool.  Genitourinary: Negative for dysuria, frequency and menstrual problem.  Musculoskeletal: Positive for myalgias, back pain, arthralgias and neck pain.  Skin: Negative for rash.  Neurological: Negative for dizziness, weakness, numbness and headaches.  Psychiatric/Behavioral: Positive for dysphoric mood. Negative for agitation.    Objective:  BP 117/70 mmHg  Pulse 89  Temp(Src) 88.5 F (31.4 C) (Oral)  Ht 5\' 4"  (1.626 m)  Wt 226 lb 12.8 oz (102.876 kg)  BMI 38.91 kg/m2  BP Readings from Last 3 Encounters:  03/31/15 117/70  03/30/15 106/49  02/12/15 147/85    Wt Readings from Last 3 Encounters:  03/31/15 226 lb 12.8 oz (102.876 kg)  02/12/15 226 lb 6.4 oz (102.694 kg)  08/14/14 206 lb (93.441 kg)     Physical Exam  Constitutional: She is oriented to person, place, and time. She appears well-developed and well-nourished. No distress.  HENT:  Head: Normocephalic and atraumatic.  Right Ear: External ear normal.  Left Ear: External ear normal.  Nose: Nose normal.  Mouth/Throat: Oropharynx  is clear and moist.  Eyes: Conjunctivae and EOM are normal. Pupils are equal, round, and reactive to light.  Neck: Normal range of motion. Neck supple. No thyromegaly present.  Cardiovascular: Normal rate, regular rhythm and normal heart sounds.   No murmur heard. Pulmonary/Chest: Effort normal and breath sounds normal. No respiratory distress. She has no wheezes. She has no rales.  Abdominal: Soft. Bowel sounds are normal. She exhibits no distension and no mass. There is tenderness (mild to moderate right lower quadrant. It is at McBurney's point. However straight leg raise and obturator signs are negative.). There is no rebound and no guarding.  Lymphadenopathy:    She has no cervical adenopathy.  Neurological: She is alert and oriented to  person, place, and time. She has normal reflexes.  Skin: Skin is warm and dry.  Psychiatric: She has a normal mood and affect. Her behavior is normal. Judgment and thought content normal.    No results found for: HGBA1C  Lab Results  Component Value Date   WBC 9.7 03/30/2015   HGB 14.1 03/30/2015   HCT 43.2 03/30/2015   PLT 335 03/30/2015   GLUCOSE 114* 03/30/2015   CHOL 170 07/08/2013   TRIG 136* 07/08/2013   HDL 38* 07/08/2013   LDLCALC 105 07/08/2013   ALT 31 03/30/2015   AST 38 03/30/2015   NA 135 03/30/2015   K 5.1 03/30/2015   CL 105 03/30/2015   CREATININE 0.84 03/30/2015   BUN 10 03/30/2015   CO2 22 03/30/2015   TSH 5.190* 03/29/2015    Dg Abd Acute W/chest  03/30/2015   CLINICAL DATA:  Right lower abdominal pain with nausea, vomiting and diarrhea of unknown duration. History of irritable bowel syndrome.  EXAM: DG ABDOMEN ACUTE W/ 1V CHEST  COMPARISON:  Upper GI series done 12/16/2008. Abdominal CT 11/26/2008.  FINDINGS: The heart size and mediastinal contours are normal. The lungs are clear. There is no pleural effusion or pneumothorax. No acute osseous findings are identified.  The bowel gas pattern is normal. There is no free intraperitoneal air or suspicious abdominal calcification. No acute osseous findings demonstrated. Transitional lumbosacral anatomy noted.  IMPRESSION: No active cardiopulmonary or abdominal process identified.   Electronically Signed   By: Carey Bullocks M.D.   On: 03/30/2015 08:28    Assessment & Plan:   Monae was seen today for hypothyroidism and rlq pain.  Diagnoses and all orders for this visit:  Hypothyroidism, unspecified hypothyroidism type  Abdominal pain, acute, right lower quadrant -     CT Abd Wo & W Cm; Future  Lupus (systemic lupus erythematosus)  Other orders -     gabapentin (NEURONTIN) 100 MG capsule; 1 daily at bedtime for 3 nights and then 23 nights then 33 nights then 63 nights. Then call for dose adjustment. -      levothyroxine (SYNTHROID, LEVOTHROID) 75 MCG tablet; Take 1 tablet (75 mcg total) by mouth daily.   I have discontinued Ms. Speirs's Fluticasone-Salmeterol, pregabalin, and Milnacipran HCl. I have also changed her levothyroxine. Additionally, I am having her start on gabapentin. Lastly, I am having her maintain her ibuprofen, fluticasone, hydroxychloroquine, etonogestrel, sertraline, rizatriptan, albuterol, cyclobenzaprine, Fluticasone Furoate-Vilanterol, Ferrous Fumarate, multivitamin-prenatal, oxyCODONE-acetaminophen, and promethazine.  Meds ordered this encounter  Medications  . gabapentin (NEURONTIN) 100 MG capsule    Sig: 1 daily at bedtime for 3 nights and then 23 nights then 33 nights then 63 nights. Then call for dose adjustment.    Dispense:  34 capsule  Refill:  0  . levothyroxine (SYNTHROID, LEVOTHROID) 75 MCG tablet    Sig: Take 1 tablet (75 mcg total) by mouth daily.    Dispense:  30 tablet    Refill:  2   We'll proceed with CT to rule out ovarian cyst rupture as well as the outside chance of appendicitis. I have recommended a gluten-free diet based on the possibility of gluten enteropathy that is not allergy based contributing to the recurrent nature of this pain. 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  Follow-up: Return in about 1 month (around 04/30/2015) for Pain, Hypothyroidism.  Mechele Claude, M.D.

## 2015-03-31 NOTE — Patient Instructions (Signed)
Gluten-Free Diet  Gluten is a protein found in wheat, rye, barley, and triticale (a cross between wheat and rye) grains. People with celiac disease need to have a gluten-free diet. With celiac disease, gluten interferes with the absorption of food and may also cause intestinal injury.  Strict compliance is important even during symptom-free periods. This means eliminating all foods with gluten from your diet permanently. This requires some significant changes but is very manageable. WHAT DO I NEED TO KNOW ABOUT A GLUTEN-FREE DIET?  Look for items labeled with "GF." Looking for GF will make it easier to identify products that are safe to eat.  Read all labels. Gluten may have been added as a minor ingredient where least expected, such as in shredded cheeses or ice creams. Always check food labels and investigate questionable ingredients. Talk to your dietitian or health care provider if you have questions about certain foods or need help finding GF foods.  Check when in doubt. If you are not sure whether an ingredient contains gluten, check with the manufacturer. Note that some manufacturers may change ingredients without notice. Always read labels.   Know how food is prepared. Since flour and cereal products are often used in the preparation of foods, it is important to be aware of the methods of preparation used, as well as the ingredients in the foods themselves. This is especially true when you are dining out. Ask restaurants if they have a gluten-free menu.  Watch for cross-contamination. Cross-contamination occurs when gluten-free foods come into contact with foods that contain gluten. It often happens during the manufacturing process. Always check the ingredient list and for warnings on packages, such as "may contain gluten."  Eat a balanced diet. It is important to still get enough fiber, iron, and B vitamins in your diet. Look for enriched whole grain gluten-free products and continue to eat  a well-balanced diet of the important non-grain items, such as vegetables, fruit, lean proteins, legumes, and dairy.  Consider taking a gluten-free multivitamin and mineral supplement. Discuss this with your health care provider. WHAT KEY WORDS HELP IDENTIFY GLUTEN? Know key words to help identify gluten. A dietitian can help you identify possible harmful ingredients in the foods you normally eat. Words to check for on food labels include:   Flour, enriched flour, bromated flour, white flour, durum flour, graham flour, phosphated flour, self-rising flour, semolina, or farina.  Starch, dextrin, modified food starch, or cereal.  Thickening, fillers, or emulsifiers.  Any kind of malt flavoring, extract, or syrup (malt is made from barley and includes malt vinegar, malted milk, and malted beverages).  Hydrolyzed vegetable protein. WHAT FOODS CAN I EAT? Below is a list of common foods that are allowed with a gluten-free diet.  Grains Products made from the following flours or grains:amaranth,bean flours, 100% buckwheat flour, corn, millet, nut flours or meals, GF oats, quinoa, rice, sorghum, teff, any all-purpose 100% GF flour mix, rice wafers, pure cornmeal tortillas, popcorn, some crackers, some chips, and hot cereals made from cornmeal. Ask your dietitian which specific hot and cold cereals are allowed. Hominy, rice or wild rice, and special GF pasta. Some Asian rice noodles or bean noodles. Arrowroot starch, corn bran, corn flour, corn germ, cornmeal, corn starch, potato flour, potato starch flour, and rice bran. Rice flours: plain, brown, and sweet. Rice polish, soy flour, tapioca starch. Vegetables All plain, fresh, frozen, or canned vegetables.  Fruits All fresh, frozen, canned, dried fruits, and fruit juices.  Meats and Other Protein Foods  Meat, fish, poultry, or eggs prepared without added wheat, rye, barley, or triticale. Some luncheon meat and some frankfurters. Pure meat. All aged  cheese, most processed cheese products, some cottage cheese, and some cream cheese. Dried beans, dried peas, and lentils.  Dairy Milk and yogurt made with allowed ingredients.  Beverages Coffee (regular or decaffeinated), tea, herbal tea (read label to be sure that no wheat flour has been added). Carbonated beverages and some root beers. Wine, sake, and distilled spirits, such as gin, vodka, and whiskey. GF beers and GF ciders.  Sweetsand Desserts Sugar, honey, some syrups, molasses, jelly, jam, plain hard candy, marshmallows, gumdrops, homemade candies free of wheat, rye, barley, or triticale. Coconut. Custard, some pudding mixes, and homemade puddings from cornstarch, rice, and tapioca. Gelatin desserts, sorbets, frozen ice pops, and sherbet. Cake, cookies, and other desserts prepared with allowed flours. Some commercial ice creams. Ask your dietitian about specific brands of dessert that are allowed.  Fats and Oils Butter, margarine, vegetable oil, sour cream not containing modified food starch, whipping cream, shortening, lard, cream, and some mayonnaise. Some commercial salad dressings. Peanut butter.  Other Homemade broth and soups made with allowed ingredients; some canned or frozen soups. Any other combination or prepared foods that do not contain gluten. Monosodium glutamate (MSG). Cider, rice, and wine vinegar. Baking soda and baking powder. Certain soy sauces (Tamari). Ask your dietitian about specific brands that are allowed. Nuts, coconut, chocolate, and pure cocoa powder. Salt, pepper, herbs, spices, extracts, and food colorings. The items listed above may not be a complete list of allowed foods or beverages. Contact your dietitian for more options.  WHAT FOODS CAN I NOT EAT? Below is a list of common foods that are not allowed with a gluten-free diet.  Grains Barley, bran, bulgur, cracked wheat, graham, malt, matzo, wheat germ, and all wheat and rye cereals including spelt and  kamut. Avoid cereals containing malt as a flavoring, such as rice cereal. Also avoid regular noodles, spaghetti, macaroni, and most packaged rice mixes, and all others containing wheat, rye, barley, or triticale.  Vegetables Most creamed vegetables, most vegetables canned in sauces, and any vegetables prepared with wheat, rye, barley, or triticale.  Fruits Thickened or prepared fruits and some pie fillings.  Meats and Other Protein Sources Any meat or meat alternative containing wheat, rye, barley, or gluten stabilizers (such as some hot dogs, salami, cold cuts, or sausage). Bread-containing products, such as Swiss steak, croquettes, and meatloaf. Most tuna canned in vegetable broth, [place] with hydrolyzed vegetable protein (HVP) injected as part of the basting, and any cheese product containing oat gum as an ingredient. [place]. Imitation fish. Dairy Commercial chocolate milk, which may have cereal added, and malted milk. Beverages Certain cereal beverages. Beer and ciders (unless GF), ale, malted milk, and some root beers. Sweetsand Desserts Commercial candies containing wheat, rye, barley, or triticale. Certain toffees are dusted with wheat flour. Chocolate-coated nuts, which are often rolled in flour. Cakes, cookies, doughnuts, and pastries that are prepared with wheat, barley, rye, or triticale flour. Some commercial ice creams, ice cream flavors which contain cookies, crumbs, or cheesecake. Ice cream cones. Commercially prepared mixes for cakes, cookies, and other desserts unless marked GF. Bread pudding and other puddings thickened with flour. Fats and Oils Some commercial salad dressings and sour cream containing modified food starch.  Condiments Some curry powder, some dry seasoning mixes, some gravy extracts, some meat sauces, some ketchup, some prepared mustard, horseradish. Other All soups containing wheat, rye, barley,  or triticale flour. Bouillon and bouillon cubes that contain  HVP. Combination or prepared foods that contain gluten. Some soy sauce, some chip dips, and some chewing gum. Yeast extract (contains barley). Caramel color (may contain malt). The items listed above may not be a complete list of foods and beverages to avoid. Contact your dietitian for more information. Document Released: 07/03/2005 Document Revised: 11/17/2013 Document Reviewed: 05/07/2013 [facility] Patient Information 2015 [place], [place]. This information is not intended to replace advice given to you by your health care provider. Make sure you discuss any questions you have with your health care provider.

## 2015-04-01 ENCOUNTER — Other Ambulatory Visit: Payer: Self-pay

## 2015-04-01 DIAGNOSIS — G8929 Other chronic pain: Secondary | ICD-10-CM

## 2015-04-01 DIAGNOSIS — R1031 Right lower quadrant pain: Principal | ICD-10-CM

## 2015-04-07 ENCOUNTER — Ambulatory Visit (HOSPITAL_COMMUNITY)
Admission: RE | Admit: 2015-04-07 | Discharge: 2015-04-07 | Disposition: A | Discharge status: Home / Self Care | Payer: 59 | Source: Ambulatory Visit | Attending: Nurse Practitioner | Admitting: Nurse Practitioner

## 2015-04-07 DIAGNOSIS — G8929 Other chronic pain: Secondary | ICD-10-CM | POA: Diagnosis present

## 2015-04-07 DIAGNOSIS — N200 Calculus of kidney: Secondary | ICD-10-CM | POA: Diagnosis not present

## 2015-04-07 DIAGNOSIS — R1031 Right lower quadrant pain: Secondary | ICD-10-CM | POA: Insufficient documentation

## 2015-04-07 DIAGNOSIS — Q631 Lobulated, fused and horseshoe kidney: Secondary | ICD-10-CM | POA: Diagnosis not present

## 2015-04-07 MED ORDER — IOHEXOL 300 MG/ML  SOLN
100.0000 mL | Freq: Once | INTRAMUSCULAR | Status: AC | PRN
  Administered 2015-04-07: 100 mL via INTRAVENOUS

## 2015-04-13 ENCOUNTER — Telehealth: Payer: Self-pay | Admitting: *Deleted

## 2015-04-13 NOTE — Telephone Encounter (Signed)
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 The remainder of the exam showed no significant abnormality. Best Regards, Mechele Claude, M.D.

## 2015-04-21 ENCOUNTER — Telehealth: Payer: Self-pay | Admitting: Family Medicine

## 2015-04-22 MED ORDER — GABAPENTIN 600 MG PO TABS
600.0000 mg | ORAL_TABLET | Freq: Every day | ORAL | Status: DC
Start: 2015-04-22 — End: 2016-06-09

## 2015-04-22 NOTE — Telephone Encounter (Signed)
Called and discussed, mild somnolence at 600 but some pain improvement. Would like to continue 600 for now. Given new Rx for 600 mg.   Murtis Sink, MD [facility] 04/22/2015, 12:36 PM

## 2015-04-23 ENCOUNTER — Ambulatory Visit (INDEPENDENT_AMBULATORY_CARE_PROVIDER_SITE_OTHER): Payer: 59 | Admitting: Neurology

## 2015-04-23 ENCOUNTER — Ambulatory Visit: Payer: BLUE CROSS/BLUE SHIELD | Admitting: Neurology

## 2015-04-23 ENCOUNTER — Encounter: Payer: Self-pay | Admitting: Neurology

## 2015-04-23 VITALS — BP 124/84 | HR 66 | Ht 64.0 in | Wt 226.8 lb

## 2015-04-23 DIAGNOSIS — M542 Cervicalgia: Secondary | ICD-10-CM | POA: Diagnosis not present

## 2015-04-23 DIAGNOSIS — G43719 Chronic migraine without aura, intractable, without status migrainosus: Secondary | ICD-10-CM | POA: Diagnosis not present

## 2015-04-23 NOTE — Patient Instructions (Signed)
Migraine Recommendations: 1.  We will stop Zoloft.  Take 2 pills daily for 7 days, then 1 pill daily for 7 days, then stop. 2.  Take Zomig nasal spray.  Spray one spray in one nostril at earliest onset of headache.  May repeat dose once in 2 hours if needed.  Do not exceed two sprays in 24 hours. 3.  Limit use of pain relievers to no more than 2 days out of the week.  These medications include acetaminophen, ibuprofen, triptans and narcotics.  This will help reduce risk of rebound headaches. 4.  Be aware of common food triggers such as processed sweets, processed foods with nitrites (such as deli meat, hot dogs, sausages), foods with MSG, alcohol (such as wine), chocolate, certain cheeses, certain fruits (dried fruits, some citrus fruit), vinegar, diet soda. 4.  Avoid caffeine 5.  Routine exercise 6.  Proper sleep hygiene 7.  Stay adequately hydrated with water 8.  Keep a headache diary. 9.  Maintain proper stress management. 10.  Do not skip meals. 11.  Consider supplements:  Magnesium oxide  to  daily, riboflavin , Coenzyme Q 10  three times daily 12.  We will refer you to Dr. Antoine Primas of Sports Medicine to address treating your neck, which is likely a trigger for your headaches. 13.  Follow up in 3 months.

## 2015-04-23 NOTE — Progress Notes (Signed)
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    UPDATE: Intensity:  10/10 Duration:  2-3 days Frequency:  5-6 days a month (total of 20-25 headache days, as she has mild chronic headaches) Current abortive medication:  Maxalt  Antihypertensive medications:  none Antidepressant medications:  sertraline  Anticonvulsant medications:  gabapentin  Vitamins/Herbal/Supplements:  none Other therapy:  biofeedback Other medication:  Flexeril for neck pain.  Sleep hygiene:  Can't sleep due to pain Stress/depression:  Panic attacks (onset since pain)  HISTORY: Onset:  Since 24 years old, worse over the past year. Location:  Travels around head, except constant on back of head Quality:  Traveling stinging pain around head and to the eye, constant nonthrobbing pressure pain in back of head.  Neck pain. Intensity:  4-10/10; December 8-9/10 Aura:  no Associated symptoms:  Photophobia, phonophobia, nausea Duration:  constant; December Usually 1 day (she had one that lasted 2 weeks) Frequency:  constant; December 25 headache days per month Triggers/exacerbating factors:  Loud high-pitch noises, lights Relieving factors: laying down, bath  Past abortive therapy:  Tylenol, ibuprofen, Excedrin migraine, sumatriptan  Past preventative therapy:  Topamax (caused a seizure), amitriptyline (exacerbated depression) Other past therapy:  PT  She started amitriptyline, but she wished to discontinue this after she found out she was pregnant.  It also caused depression.  During her pregnancy, she continued to have mild headaches but migraines dissipated.  She gave birth via spontaneous vaginal delivery in October.   Mother and baby are doing fine.  She is not breastfeeding.  Since giving birth, she has had increase in migraines.  Family history of headache:  Mom has history of migraine and fibromyalgia  Around the same time, she developed diffuse body pain.  She does report neck pain that can shoot down the spine and sometimes down the left posterior thigh to above the knee.  Sometimes, she notes a pressure across her upper back, like she is wearing a TENS unit, as well as tingling.  She also notes pain in the chest and sternum.  She has significant pain in her joints and throughout her body.  She denies warm joints, fevers or rash.  No associated numbness.  MRI of brain with and without contrast performed on 07/13/14 was unremarkable.  PAST MEDICAL HISTORY: Past Medical History  Diagnosis Date  . Hypothyroidism   . Anxiety   . Vitamin D deficiency   . Depression   . 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 Fibromyalgia   . Lupus (HCC)   . Warm antibody hemolytic anemia (HCC)   . Diabetes mellitus without complication (HCC)   . Hypertension   . GERD (gastroesophageal reflux disease)   . IBS (irritable bowel syndrome)   . Headache   . Fibromyalgia   . Hx of migraines     MEDICATIONS: Current Outpatient Prescriptions on File Prior to Visit  Medication Sig Dispense Refill  . albuterol (PROVENTIL HFA;VENTOLIN HFA) 108 (90 BASE) MCG/ACT inhaler Inhale 2 puffs into the lungs every 6 (six) hours as needed for wheezing or shortness of breath. 1 Inhaler 11  . cyclobenzaprine (FLEXERIL) 10 MG tablet Take 1 tablet (10 mg total) by mouth 3 (three) times daily as needed for muscle spasms. 90 tablet 3  . etonogestrel (  NEXPLANON) 68 MG IMPL implant 1 each by Subdermal route once.    . Ferrous Fumarate 324 (106 FE) MG TABS Take 324 mg by mouth 2 (two) times daily. 60 tablet 5  . fluticasone (FLONASE) 50 MCG/ACT nasal spray Place 2 sprays into both nostrils daily. 16 g 6  . Fluticasone Furoate-Vilanterol  (BREO ELLIPTA) 100-25 MCG/INH AEPB Inhale 1 Inhaler into the lungs every morning. 60 each 11  . gabapentin (NEURONTIN) 100 MG capsule 1 daily at bedtime for 3 nights and then 23 nights then 33 nights then 63 nights. Then call for dose adjustment. 34 capsule 0  . gabapentin (NEURONTIN) 600 MG tablet Take 1 tablet (600 mg total) by mouth at bedtime. 30 tablet 0  . hydroxychloroquine (PLAQUENIL) 200 MG tablet Take 200 mg by mouth 3 (three) times daily.   0  . ibuprofen (ADVIL,MOTRIN) 600 MG tablet Take 1 tablet (600 mg total) by mouth every 6 (six) hours. 30 tablet 1  . levothyroxine (SYNTHROID, LEVOTHROID) 75 MCG tablet Take 1 tablet (75 mcg total) by mouth daily. 30 tablet 2  . oxyCODONE-acetaminophen (PERCOCET/ROXICET) 5-325 MG per tablet Take 1-2 tablets by mouth every 6 (six) hours as needed. 16 tablet 0  . Prenatal Vit-Fe Fumarate-FA (MULTIVITAMIN-PRENATAL) 27-0.8 MG TABS tablet Take 1 tablet by mouth daily at 12 noon.    . promethazine (PHENERGAN) 25 MG tablet Take 1 tablet (25 mg total) by mouth every 6 (six) hours as needed for nausea or vomiting. 10 tablet 0  . rizatriptan (MAXALT-MLT) 10 MG disintegrating tablet Take 1 tablet (10 mg total) by mouth as needed for migraine. May repeat x1 in 2 hours if needed.  Do not exceed 2 tablets in 24 hrs 9 tablet 3  . sertraline (ZOLOFT) 25 MG tablet Take 3 tablets (75 mg total) by mouth daily. 90 tablet 2   No current facility-administered medications on file prior to visit.    ALLERGIES: Allergies  Allergen Reactions  . Celexa [Citalopram Hydrobromide] Diarrhea and Other (See Comments)    Pt states that this medication caused her limbs to go numb.   . Ranitidine Diarrhea and Other (See Comments)    Pt states that this causes severe stomach pain.   . Topamax [Topiramate] Nausea And Vomiting and Other (See Comments)    Pt states that this medication caused seizures and she was unable to speak.     FAMILY HISTORY: Family History  Problem  Relation Age of Onset  . Fibromyalgia Mother   . Heart disease Mother   . Hypertension Mother   . Lupus Mother   . Hypertension Father   . Diabetes Paternal Grandfather     SOCIAL HISTORY: Social History   Social History  . Marital Status: Married    Spouse Name: N/A  . Number of Children: N/A  . Years of Education: N/A   Occupational History  . Not on file.   Social History Main Topics  . Smoking status: Former Smoker    Quit date: 07/29/2013  . Smokeless tobacco: Never Used  . Alcohol Use: No  . Drug Use: No  . Sexual Activity:    Partners: Male   Other Topics Concern  . Not on file   Social History Narrative    REVIEW OF SYSTEMS: Constitutional: No fevers, chills, or sweats, no generalized fatigue, change in appetite Eyes: No visual changes, double vision, eye pain Ear, nose and throat: No hearing loss, ear pain, nasal congestion, sore throat Cardiovascular: No chest pain, palpitations Respiratory:  No shortness of breath at rest or with exertion, wheezes GastrointestinaI: No nausea, vomiting, diarrhea, abdominal pain, fecal incontinence Genitourinary:  No dysuria, urinary retention or frequency Musculoskeletal:  Neck pain Integumentary: No rash, pruritus, skin lesions Neurological: as above Psychiatric: anxiety Endocrine: No palpitations, fatigue, diaphoresis, mood swings, change in appetite, change in weight, increased thirst Hematologic/Lymphatic:  No anemia, purpura, petechiae. Allergic/Immunologic: no itchy/runny eyes, nasal congestion, recent allergic reactions, rashes  PHYSICAL EXAM: Filed Vitals:   04/23/15 1133  BP: 124/84  Pulse: 66   General: No acute distress.  Patient appears well-groomed.   Head:  Normocephalic/atraumatic Eyes:  Fundoscopic exam unremarkable without vessel changes, exudates, hemorrhages or papilledema. Neck: supple, bilateral paraspinal tenderness, full range of motion Heart:  Regular rate and rhythm Lungs:  Clear to  auscultation bilaterally Back: No paraspinal tenderness Neurological Exam: alert and oriented to person, place, and time. Attention span and concentration intact, recent and remote memory intact, fund of knowledge intact.  Speech fluent and not dysarthric, language intact.  CN II-XII intact. Fundoscopic exam unremarkable without vessel changes, exudates, hemorrhages or papilledema.  Bulk and tone normal, muscle strength 5/5 throughout.  Sensation to light touch intact.  Deep tendon reflexes 2+ throughout.  Finger to nose and heel to shin testing intact.  Gait normal  IMPRESSION: Chronic migraine without aura with neck pain  PLAN: 1.  Will refer to Dr. Antoine Primas for treatment of neck pain 2.  Will taper off of sertraline 3.  Try samples of Zomig 5mg  nasal spray 4.  Follow up in 3 months.  15 minutes spent face to face with patient, over 50% spent discussing management.  Shon Millet, DO  CC:  Mechele Claude, MD

## 2015-04-26 ENCOUNTER — Telehealth: Payer: Self-pay | Admitting: *Deleted

## 2015-04-26 NOTE — Telephone Encounter (Signed)
L/M for patient date and time of her  appointment with Dr Ian Malkin Smith,MD

## 2015-05-06 ENCOUNTER — Ambulatory Visit: Payer: 59 | Admitting: Family Medicine

## 2015-05-06 DIAGNOSIS — Z0289 Encounter for other administrative examinations: Secondary | ICD-10-CM

## 2015-05-20 ENCOUNTER — Ambulatory Visit (INDEPENDENT_AMBULATORY_CARE_PROVIDER_SITE_OTHER): Payer: 59

## 2015-05-20 DIAGNOSIS — Z23 Encounter for immunization: Secondary | ICD-10-CM | POA: Diagnosis not present

## 2015-07-13 ENCOUNTER — Ambulatory Visit: Payer: 59 | Admitting: Neurology

## 2015-09-01 ENCOUNTER — Other Ambulatory Visit: Payer: Self-pay | Admitting: Family Medicine

## 2015-10-27 ENCOUNTER — Telehealth: Payer: Self-pay | Admitting: Family

## 2015-10-27 DIAGNOSIS — N390 Urinary tract infection, site not specified: Secondary | ICD-10-CM

## 2015-10-27 MED ORDER — SULFAMETHOXAZOLE-TRIMETHOPRIM 800-160 MG PO TABS: 1.0000 | ORAL_TABLET | Freq: Two times a day (BID) | ORAL | Status: DC

## 2015-10-27 NOTE — Progress Notes (Signed)

## 2016-02-23 ENCOUNTER — Encounter: Payer: Self-pay | Admitting: *Deleted

## 2016-05-18 ENCOUNTER — Other Ambulatory Visit: Payer: Self-pay | Admitting: Family Medicine

## 2016-05-19 NOTE — Telephone Encounter (Signed)
Last TSH was 03/2015 and was abnormal? She will be here at 2:15 for flu shot today (FYI)

## 2016-05-25 ENCOUNTER — Telehealth: Payer: Self-pay | Admitting: Family Medicine

## 2016-05-29 ENCOUNTER — Telehealth: Payer: Self-pay | Admitting: Family Medicine

## 2016-05-30 ENCOUNTER — Other Ambulatory Visit: Payer: Self-pay | Admitting: *Deleted

## 2016-05-30 MED ORDER — LEVOTHYROXINE SODIUM 75 MCG PO TABS: 75.0000 ug | ORAL_TABLET | Freq: Every day | ORAL | 0 refills | Status: DC

## 2016-05-30 NOTE — Telephone Encounter (Signed)
[facility] and patient will need to be seen for any further refills

## 2016-06-09 ENCOUNTER — Encounter: Payer: Self-pay | Admitting: Family Medicine

## 2016-06-09 ENCOUNTER — Ambulatory Visit (INDEPENDENT_AMBULATORY_CARE_PROVIDER_SITE_OTHER): Payer: Commercial Managed Care - PPO | Admitting: Family Medicine

## 2016-06-09 VITALS — BP 124/84 | HR 84 | Ht 64.0 in | Wt 224.5 lb

## 2016-06-09 DIAGNOSIS — D591 Other autoimmune hemolytic anemias: Secondary | ICD-10-CM | POA: Diagnosis not present

## 2016-06-09 DIAGNOSIS — E039 Hypothyroidism, unspecified: Secondary | ICD-10-CM | POA: Diagnosis not present

## 2016-06-09 DIAGNOSIS — F331 Major depressive disorder, recurrent, moderate: Secondary | ICD-10-CM | POA: Diagnosis not present

## 2016-06-09 DIAGNOSIS — M328 Other forms of systemic lupus erythematosus: Secondary | ICD-10-CM | POA: Diagnosis not present

## 2016-06-09 DIAGNOSIS — Z23 Encounter for immunization: Secondary | ICD-10-CM | POA: Diagnosis not present

## 2016-06-09 DIAGNOSIS — D5911 Warm autoimmune hemolytic anemia: Secondary | ICD-10-CM

## 2016-06-09 DIAGNOSIS — F419 Anxiety disorder, unspecified: Secondary | ICD-10-CM | POA: Diagnosis not present

## 2016-06-09 MED ORDER — DULOXETINE HCL 30 MG PO CPEP: 30.0000 mg | ORAL_CAPSULE | Freq: Every day | ORAL | 0 refills | Status: DC

## 2016-06-09 MED ORDER — LEVOTHYROXINE SODIUM 75 MCG PO TABS: 75.0000 ug | ORAL_TABLET | Freq: Every day | ORAL | 5 refills | Status: DC

## 2016-06-09 MED ORDER — HYDROXYCHLOROQUINE SULFATE 200 MG PO TABS: 200.0000 mg | ORAL_TABLET | Freq: Three times a day (TID) | ORAL | 5 refills | Status: DC

## 2016-06-09 MED ORDER — CYCLOBENZAPRINE HCL 10 MG PO TABS: 10.0000 mg | ORAL_TABLET | Freq: Three times a day (TID) | ORAL | 5 refills | Status: DC | PRN

## 2016-06-09 NOTE — Progress Notes (Signed)
Subjective:  Patient ID: [name] [name], female    DOB: 1991-04-30  Age: 25 y.o. MRN: [id number]  CC: Annual Exam   HPI [name] [name] presents for Patient presents for follow-up on  thyroid. She has a history of hypothyroidism for many years. It has been stable recently. Pt. denies any change in  voice, loss of hair, heat or cold intolerance. Energy level has been adequate to good. She denies constipation and diarrhea. No myxedema. Medication is as noted below. Verified that pt is taking it daily on an empty stomach. Well tolerated.  Patient is also here for follow-up on anemia. She has been treated for hemolytic anemia. Also achy from lupus and fibromyalgia.MUuscles feel tight all over. Stress very high due to work and children.  GAD 7 : Generalized Anxiety Score 06/09/2016  Nervous, Anxious, on Edge 3  Control/stop worrying 3  Worry too much - different things 3  Trouble relaxing 3  Restless 3  Easily annoyed or irritable 3  Afraid - awful might happen 3  Total GAD 7 Score 21  Anxiety Difficulty Not difficult at all    Depression screen [facility] 2/9 06/09/2016 03/31/2015  Decreased Interest 2 0  Down, Depressed, Hopeless 2 0  PHQ - 2 Score 4 0  Altered sleeping 3 -  Tired, decreased energy 3 -  Change in appetite 3 -  Feeling bad or failure about yourself  3 -  Trouble concentrating 2 -  Moving slowly or fidgety/restless 2 -  Suicidal thoughts 0 -  PHQ-9 Score 20 -    History Celines has a past medical history of Anxiety; 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   She has a past surgical history that includes No past surgeries.   Her family history includes Diabetes  in her paternal grandfather; Fibromyalgia in her mother; Heart disease in her mother; Hypertension in her father and mother; Lupus in her mother.She reports that she quit smoking about 2 years ago. She has never used smokeless tobacco. She reports that she does not drink alcohol or use drugs.  Outpatient Medications Prior to Visit  Medication Sig Dispense Refill  . albuterol (PROVENTIL HFA;VENTOLIN HFA) 108 (90 BASE) MCG/ACT inhaler Inhale 2 puffs into the lungs every 6 (six) hours as needed for wheezing or shortness of breath. 1 Inhaler 11  . etonogestrel (NEXPLANON) 68 MG IMPL implant 1 each by Subdermal route once.    . fluticasone (FLONASE) 50 MCG/ACT nasal spray Place 2 sprays into both nostrils daily. 16 g 6  . ibuprofen (ADVIL,MOTRIN) 600 MG tablet Take 1 tablet (600 mg total) by mouth every 6 (six) hours. 30 tablet 1  . cyclobenzaprine (FLEXERIL) 10 MG tablet Take 1 tablet (10 mg total) by mouth 3 (three) times daily as needed for muscle spasms. 90 tablet 3  . levothyroxine (SYNTHROID, LEVOTHROID) 75 MCG tablet Take 1 tablet (75 mcg total) by mouth daily. 30 tablet 0  . Prenatal Vit-Fe Fumarate-FA (MULTIVITAMIN-PRENATAL) 27-0.8 MG TABS tablet Take 1 tablet by mouth daily at 12 noon.    . Ferrous Fumarate 324 (106 FE) MG TABS Take 324 mg by mouth 2 (two) times daily. 60 tablet 5  . Fluticasone Furoate-Vilanterol (BREO ELLIPTA) 100-25 MCG/INH AEPB Inhale 1 Inhaler into the lungs every morning. 60 each 11  . gabapentin (NEURONTIN) 100 MG  capsule 1 daily at bedtime for 3 nights and then 23 nights then 33 nights then 63 nights. Then call for dose adjustment. 34 capsule 0  . gabapentin (NEURONTIN) 600 MG tablet Take 1 tablet (600 mg total) by mouth at bedtime. 30 tablet 0  . hydroxychloroquine (PLAQUENIL) 200 MG tablet Take 200 mg by mouth 3 (three) times daily.   0  . oxyCODONE-acetaminophen (PERCOCET/ROXICET) 5-325 MG per tablet Take 1-2 tablets by mouth every 6 (six) hours as needed. 16  tablet 0  . promethazine (PHENERGAN) 25 MG tablet Take 1 tablet (25 mg total) by mouth every 6 (six) hours as needed for nausea or vomiting. 10 tablet 0  . sulfamethoxazole-trimethoprim (BACTRIM DS,SEPTRA DS) 800-160 MG tablet Take 1 tablet by mouth 2 (two) times daily. 10 tablet 0   No facility-administered medications prior to visit.     ROS Review of Systems  Constitutional: Negative for appetite change, chills, diaphoresis, fatigue, fever and unexpected weight change.  HENT: Negative for congestion, ear pain, hearing loss, postnasal drip, rhinorrhea, sneezing, sore throat and trouble swallowing.   Eyes: Negative for pain.  Respiratory: Negative for cough, chest tightness and shortness of breath.   Cardiovascular: Negative for chest pain and palpitations.  Gastrointestinal: Negative for abdominal pain, constipation, diarrhea, nausea and vomiting.  Genitourinary: Negative for dysuria, frequency and menstrual problem.  Musculoskeletal: Negative for arthralgias and joint swelling.  Skin: Negative for rash.  Neurological: Negative for dizziness, weakness, numbness and headaches.  Psychiatric/Behavioral: Negative for agitation and dysphoric mood.    Objective:  BP 124/84   Pulse 84   Ht 5' 4"  (1.626 m)   Wt 224 lb 8 oz (101.8 kg)   BMI 38.54 kg/m   BP Readings from Last 3 Encounters:  06/09/16 124/84  04/23/15 124/84  03/31/15 117/70    Wt Readings from Last 3 Encounters:  06/09/16 224 lb 8 oz (101.8 kg)  04/23/15 226 lb 12.8 oz (102.9 kg)  03/31/15 226 lb 12.8 oz (102.9 kg)     Physical Exam  Constitutional: She is oriented to person, place, and time. She appears well-developed and well-nourished. No distress.  HENT:  Head: Normocephalic and atraumatic.  Right Ear: External ear normal.  Left Ear: External ear normal.  Nose: Nose normal.  Mouth/Throat: Oropharynx is clear and moist.  Eyes: Conjunctivae and EOM are normal. Pupils are equal, round, and reactive to  light.  Neck: Normal range of motion. Neck supple. No thyromegaly present.  Cardiovascular: Normal rate, regular rhythm and normal heart sounds.   No murmur heard. Pulmonary/Chest: Effort normal and breath sounds normal. No respiratory distress. She has no wheezes. She has no rales.  Abdominal: Soft. Bowel sounds are normal. She exhibits no distension. There is no tenderness.  Lymphadenopathy:    She has no cervical adenopathy.  Neurological: She is alert and oriented to person, place, and time. She has normal reflexes.  Skin: Skin is warm and dry.  Psychiatric: She has a normal mood and affect. Her behavior is normal. Judgment and thought content normal.    No results found for: HGBA1C  Lab Results  Component Value Date   WBC 9.7 03/30/2015   HGB 14.1 03/30/2015   HCT 43.2 03/30/2015   PLT 335 03/30/2015   GLUCOSE 114 (H) 03/30/2015   CHOL 170 07/08/2013   TRIG 136 (H) 07/08/2013   HDL 38 (L) 07/08/2013   LDLCALC 105 07/08/2013   ALT 31 03/30/2015   AST 38 03/30/2015   NA 135 03/30/2015  K 5.1 03/30/2015   CL 105 03/30/2015   CREATININE 0.84 03/30/2015   BUN 10 03/30/2015   CO2 22 03/30/2015   TSH 5.190 (H) 03/29/2015    Mr Brain W Wo Contrast  07/13/2014   CLINICAL DATA:  Intractable chronic migraine without aura. Chronic pain syndrome. Numbness and tingling. Lupus and fibromyalgia. Rule out multiple sclerosis  EXAM: MRI HEAD WITHOUT AND WITH CONTRAST  TECHNIQUE: Multiplanar, multiecho pulse sequences of the brain and surrounding structures were obtained without and with intravenous contrast.  CONTRAST:  50m MULTIHANCE GADOBENATE DIMEGLUMINE 529 MG/ML IV SOLN  COMPARISON:  CT head 05/29/2013  FINDINGS: Ventricle size is normal. Negative for Chiari malformation. Pituitary is normal in size.  Negative for acute or chronic ischemia  Negative for demyelinating disease. Brainstem is normal. White matter is normal.  Negative for hemorrhage or fluid collection.  Negative for mass  or edema.  Paranasal sinuses are clear.  IMPRESSION: Normal   Electronically Signed   By: CFranchot GalloM.D.   On: 07/13/2014 20:20    Assessment & Plan:   LMasiewas seen today for annual exam.  Diagnoses and all orders for this visit:  Hypothyroidism, unspecified type -     CBC with Differential/Platelet -     CMP14+EGFR -     TSH + free T4  Anxiety -     CBC with Differential/Platelet -     CMP14+EGFR  Moderate episode of recurrent major depressive disorder ([place] -     CBC with Differential/Platelet -     CMP14+EGFR  Other forms of systemic lupus erythematosus, unspecified organ involvement status ([place] -     CBC with Differential/Platelet -     CMP14+EGFR -     Sedimentation rate -     Arthritis Panel -     Anti-DNA antibody, double-stranded  Warm antibody hemolytic anemia (HCC) -     CBC with Differential/Platelet -     CMP14+EGFR  Other orders -     cyclobenzaprine (FLEXERIL) 10 MG tablet; Take 1 tablet (10 mg total) by mouth 3 (three) times daily as needed for muscle spasms. -     hydroxychloroquine (PLAQUENIL) 200 MG tablet; Take 1 tablet (200 mg total) by mouth 3 (three) times daily. -     levothyroxine (SYNTHROID, LEVOTHROID) 75 MCG tablet; Take 1 tablet (75 mcg total) by mouth daily. -     DULoxetine (CYMBALTA) 30 MG capsule; Take 1 capsule (30 mg total) by mouth daily. For one week then two daily. Take with a full stomach at suppertime   I have discontinued Ms. Bodley's fluticasone furoate-vilanterol, Ferrous Fumarate, multivitamin-prenatal, oxyCODONE-acetaminophen, promethazine, gabapentin, gabapentin, and sulfamethoxazole-trimethoprim. I have also changed her hydroxychloroquine. Additionally, I am having her start on DULoxetine. Lastly, I am having her maintain her ibuprofen, fluticasone, etonogestrel, albuterol, cyclobenzaprine, and levothyroxine.  Meds ordered this encounter  Medications  . cyclobenzaprine (FLEXERIL) 10 MG tablet    Sig: Take 1 tablet  (10 mg total) by mouth 3 (three) times daily as needed for muscle spasms.    Dispense:  90 tablet    Refill:  5  . hydroxychloroquine (PLAQUENIL) 200 MG tablet    Sig: Take 1 tablet (200 mg total) by mouth 3 (three) times daily.    Dispense:  90 tablet    Refill:  5  . levothyroxine (SYNTHROID, LEVOTHROID) 75 MCG tablet    Sig: Take 1 tablet (75 mcg total) by mouth daily.    Dispense:  30 tablet  Refill:  5  . DULoxetine (CYMBALTA) 30 MG capsule    Sig: Take 1 capsule (30 mg total) by mouth daily. For one week then two daily. Take with a full stomach at suppertime    Dispense:  60 capsule    Refill:  0     Follow-up: Return in about 1 month (around 07/09/2016).  Claretta Fraise, M.D.

## 2016-06-10 LAB — ARTHRITIS PANEL
BASOS ABS: 0 10*3/uL (ref 0.0–0.2)
Basos: 0 %
EOS (ABSOLUTE): 0.2 10*3/uL (ref 0.0–0.4)
Eos: 3 %
HEMATOCRIT: 40.7 % (ref 34.0–46.6)
Hemoglobin: 13.2 g/dL (ref 11.1–15.9)
IMMATURE GRANULOCYTES: 0 %
Immature Grans (Abs): 0 10*3/uL (ref 0.0–0.1)
LYMPHS ABS: 2.1 10*3/uL (ref 0.7–3.1)
Lymphs: 25 %
MCH: 27.8 pg (ref 26.6–33.0)
MCHC: 32.4 g/dL (ref 31.5–35.7)
MCV: 86 fL (ref 79–97)
MONOS ABS: 0.5 10*3/uL (ref 0.1–0.9)
Monocytes: 6 %
NEUTROS PCT: 66 %
Neutrophils Absolute: 5.5 10*3/uL (ref 1.4–7.0)
PLATELETS: 393 10*3/uL — AB (ref 150–379)
RBC: 4.74 x10E6/uL (ref 3.77–5.28)
RDW: 14.2 % (ref 12.3–15.4)
Sed Rate: 5 mm/hr (ref 0–32)
Uric Acid: 5.7 mg/dL (ref 2.5–7.1)
WBC: 8.4 10*3/uL (ref 3.4–10.8)

## 2016-06-10 LAB — ANTI-DNA ANTIBODY, DOUBLE-STRANDED: DSDNA AB: 1 [IU]/mL (ref 0–9)

## 2016-06-10 LAB — CMP14+EGFR
ALBUMIN: 4.5 g/dL (ref 3.5–5.5)
ALK PHOS: 86 IU/L (ref 39–117)
ALT: 26 IU/L (ref 0–32)
AST: 16 IU/L (ref 0–40)
Albumin/Globulin Ratio: 1.6 (ref 1.2–2.2)
BUN/Creatinine Ratio: 15 (ref 9–23)
BUN: 12 mg/dL (ref 6–20)
Bilirubin Total: 0.4 mg/dL (ref 0.0–1.2)
CALCIUM: 9.2 mg/dL (ref 8.7–10.2)
CO2: 21 mmol/L (ref 18–29)
CREATININE: 0.81 mg/dL (ref 0.57–1.00)
Chloride: 102 mmol/L (ref 96–106)
GFR calc Af Amer: 116 mL/min/{1.73_m2} (ref >59)
GFR, EST NON AFRICAN AMERICAN: 101 mL/min/{1.73_m2} (ref >59)
GLOBULIN, TOTAL: 2.8 g/dL (ref 1.5–4.5)
GLUCOSE: 78 mg/dL (ref 65–99)
Potassium: 4.6 mmol/L (ref 3.5–5.2)
SODIUM: 142 mmol/L (ref 134–144)
Total Protein: 7.3 g/dL (ref 6.0–8.5)

## 2016-06-10 LAB — TSH+FREE T4
FREE T4: 1.22 ng/dL (ref 0.82–1.77)
TSH: 3.81 u[IU]/mL (ref 0.450–4.500)

## 2016-06-12 ENCOUNTER — Encounter: Payer: Self-pay | Admitting: Family Medicine

## 2016-06-13 ENCOUNTER — Telehealth: Payer: Self-pay | Admitting: Family Medicine

## 2016-06-13 ENCOUNTER — Other Ambulatory Visit: Payer: Self-pay | Admitting: Family Medicine

## 2016-06-13 MED ORDER — VENLAFAXINE HCL ER 75 MG PO CP24: 75.0000 mg | ORAL_CAPSULE | Freq: Every day | ORAL | 2 refills | Status: DC

## 2016-06-13 NOTE — Telephone Encounter (Signed)
Pt notified of recommendation She already sees a rheumatologist She will schedule appt with him

## 2016-06-13 NOTE — Telephone Encounter (Signed)
Please contact the patient I sent in a substitute for the Cymbalta, venlafaxine. However, there is no good substitute for the plaquenil. If desired a referral to rheumatology would be appropriate

## 2016-06-13 NOTE — Progress Notes (Signed)
Patient aware.

## 2016-06-23 ENCOUNTER — Encounter: Payer: Self-pay | Admitting: Family Medicine

## 2016-07-23 ENCOUNTER — Encounter: Payer: Self-pay | Admitting: Family Medicine

## 2016-07-24 NOTE — Telephone Encounter (Signed)
Ms. [name] [name] needs to come in for follow up. If I document the problems with effexor, it is possible cymbalta can be approved. WS

## 2016-08-04 ENCOUNTER — Ambulatory Visit: Payer: Commercial Managed Care - PPO | Admitting: Family Medicine

## 2016-11-28 ENCOUNTER — Telehealth: Payer: Self-pay | Admitting: Family Medicine

## 2016-11-29 NOTE — Telephone Encounter (Signed)
Pt was in the office yesterday with her daughter and picked the completed form out. If she needs a PPD or any further immunizations she will call back to schedule.

## 2016-12-08 ENCOUNTER — Ambulatory Visit (INDEPENDENT_AMBULATORY_CARE_PROVIDER_SITE_OTHER): Payer: Commercial Managed Care - PPO | Admitting: *Deleted

## 2016-12-08 DIAGNOSIS — Z23 Encounter for immunization: Secondary | ICD-10-CM

## 2016-12-12 ENCOUNTER — Ambulatory Visit (INDEPENDENT_AMBULATORY_CARE_PROVIDER_SITE_OTHER): Payer: Commercial Managed Care - PPO | Admitting: *Deleted

## 2016-12-12 DIAGNOSIS — Z111 Encounter for screening for respiratory tuberculosis: Secondary | ICD-10-CM

## 2016-12-12 DIAGNOSIS — Z23 Encounter for immunization: Secondary | ICD-10-CM

## 2016-12-12 NOTE — Progress Notes (Signed)
PPD placed L forearm Pt tolerated well 

## 2016-12-15 LAB — TB SKIN TEST
Induration: 0 mm
TB Skin Test: NEGATIVE

## 2016-12-22 ENCOUNTER — Encounter: Payer: Self-pay | Admitting: Family Medicine

## 2016-12-22 ENCOUNTER — Ambulatory Visit (INDEPENDENT_AMBULATORY_CARE_PROVIDER_SITE_OTHER): Payer: Commercial Managed Care - PPO | Admitting: Family Medicine

## 2016-12-22 VITALS — BP 136/80 | HR 92 | Temp 97.8°F | Ht 64.0 in | Wt 225.0 lb

## 2016-12-22 DIAGNOSIS — F329 Major depressive disorder, single episode, unspecified: Secondary | ICD-10-CM | POA: Diagnosis not present

## 2016-12-22 DIAGNOSIS — F419 Anxiety disorder, unspecified: Secondary | ICD-10-CM | POA: Diagnosis not present

## 2016-12-22 DIAGNOSIS — Z3046 Encounter for surveillance of implantable subdermal contraceptive: Secondary | ICD-10-CM | POA: Diagnosis not present

## 2016-12-22 MED ORDER — BUPROPION HCL ER (XL) 150 MG PO TB24: 150.0000 mg | ORAL_TABLET | Freq: Every day | ORAL | 1 refills | Status: DC

## 2016-12-22 MED ORDER — HYDROXYZINE HCL 25 MG PO TABS: 25.0000 mg | ORAL_TABLET | Freq: Three times a day (TID) | ORAL | 1 refills | Status: DC | PRN

## 2016-12-22 NOTE — Progress Notes (Signed)
BP 136/80   Pulse 92   Temp 97.8 F (36.6 C) (Oral)   Ht 5\' 4"  (1.626 m)   Wt 225 lb (102.1 kg)   BMI 38.62 kg/m    Subjective:    Patient ID: [name] [name], female    DOB: 10-Dec-1990, 26 y.o.   MRN: [id number]  HPI: [name] [name] is a 26 y.o. female presenting on 12/22/2016 for Nexplanon removal and Anxiety & Depression (going through and divorce)   HPI Nexplanon removal Patient is going through divorce and not sexually active currently. She has been gaining a lot of weight over the past year while she's been on the next, and she would like to have it removed. Since she is not sexually active currently she does not want to discuss any other forms of birth control currently but will come back when she does want to discuss them. She denies any major menstrual irregularities but mainly she was just worried about the weight gain.  Anxiety and depression Patient is coming in for anxiety and depression. She had been on Effexor previously but did not like the way that made her feel side effects from it including dizziness and shakiness, she tried it for about a week and did not like it. She is coming into today to discuss other options for anxiety and depression. She has been feeling a lot more anxious recently and does have some periods of sadness and crying episodes. She denies any suicidal ideations or thoughts of hurting herself. She would like to try something that will help her with her anxiety and depression but will not cause weight gain. A lot of her anxiety and depression currently is stemming from the breakup between her husband and the process of going through divorce court. She has a lot of motivation to live.  Relevant past medical, surgical, family and social history reviewed and updated as indicated. Interim medical history since our last visit reviewed. Allergies and medications reviewed and updated.  Review of Systems  Constitutional: Positive for unexpected weight change.  Negative for chills and fever.  Eyes: Negative for redness and visual disturbance.  Respiratory: Negative for chest tightness and shortness of breath.   Cardiovascular: Negative for chest pain and leg swelling.  Musculoskeletal: Negative for back pain and gait problem.  Skin: Negative for rash.  Neurological: Negative for light-headedness and headaches.  Psychiatric/Behavioral: Positive for dysphoric mood. Negative for agitation, behavioral problems, self-injury, sleep disturbance and suicidal ideas. The patient is nervous/anxious.   All other systems reviewed and are negative.   Per HPI unless specifically indicated above     Objective:    BP 136/80   Pulse 92   Temp 97.8 F (36.6 C) (Oral)   Ht 5\' 4"  (1.626 m)   Wt 225 lb (102.1 kg)   BMI 38.62 kg/m   Wt Readings from Last 3 Encounters:  12/22/16 225 lb (102.1 kg)  06/09/16 224 lb 8 oz (101.8 kg)  04/23/15 226 lb 12.8 oz (102.9 kg)    Physical Exam  Constitutional: She is oriented to person, place, and time. She appears well-developed and well-nourished. No distress.  Eyes: Conjunctivae are normal.  Neck: Neck supple.  Cardiovascular: Normal rate, regular rhythm, normal heart sounds and intact distal pulses.   No murmur heard. Pulmonary/Chest: Effort normal and breath sounds normal. No respiratory distress. She has no wheezes. She has no rales.  Musculoskeletal: Normal range of motion.  Lymphadenopathy:    She has no cervical adenopathy.  Neurological: She  is alert and oriented to person, place, and time. Coordination normal.  Skin: Skin is warm and dry. No rash noted. She is not diaphoretic.  Psychiatric: Her behavior is normal. Judgment normal. Her mood appears anxious. She exhibits a depressed mood. She expresses no suicidal ideation. She expresses no suicidal plans.  Nursing note and vitals reviewed.  Nexplanon removal: Nexplanon is palpable on her left upper inner arm. Site was prepped with Betadine. 4 mL of 2%  lidocaine without epinephrine were used for anesthesia. 15 blade was used to dissect down to 1 and of the device. Forceps were used to grab the end of the device and extracted which came out intact and whole. Steri-Strips were placed to close the small incision. Pressure dressing was placed over the site to prevent bruising. Patient tolerated procedure well and bleeding was minimal.     Assessment & Plan:   Problem List Items Addressed This Visit      Other   Anxiety and depression - Primary   Relevant Medications   buPROPion (WELLBUTRIN XL) 150 MG 24 hr tablet   hydrOXYzine (ATARAX/VISTARIL) 25 MG tablet    Other Visit Diagnoses    Nexplanon removal           Follow up plan: Return in about 4 weeks (around 01/19/2017), or if symptoms worsen or fail to improve, for Anxiety depression recheck.  Counseling provided for all of the vaccine components No orders of the defined types were placed in this encounter.   Arville Care, MD [facility] 12/22/2016, 12:11 PM

## 2017-01-01 ENCOUNTER — Encounter: Payer: Self-pay | Admitting: Family Medicine

## 2017-01-01 MED ORDER — ETONOGESTREL-ETHINYL ESTRADIOL 0.12-0.015 MG/24HR VA RING: VAGINAL_RING | VAGINAL | 12 refills | Status: AC

## 2017-01-26 ENCOUNTER — Encounter: Payer: Self-pay | Admitting: Family Medicine

## 2017-01-26 ENCOUNTER — Ambulatory Visit (INDEPENDENT_AMBULATORY_CARE_PROVIDER_SITE_OTHER): Payer: Commercial Managed Care - PPO | Admitting: Family Medicine

## 2017-01-26 VITALS — BP 137/78 | HR 103 | Temp 98.2°F | Ht 64.0 in | Wt 226.0 lb

## 2017-01-26 DIAGNOSIS — F329 Major depressive disorder, single episode, unspecified: Secondary | ICD-10-CM

## 2017-01-26 DIAGNOSIS — E039 Hypothyroidism, unspecified: Secondary | ICD-10-CM

## 2017-01-26 DIAGNOSIS — F419 Anxiety disorder, unspecified: Secondary | ICD-10-CM

## 2017-01-26 MED ORDER — HYDROXYZINE HCL 25 MG PO TABS: 25.0000 mg | ORAL_TABLET | Freq: Three times a day (TID) | ORAL | 5 refills | Status: DC | PRN

## 2017-01-26 MED ORDER — CYCLOBENZAPRINE HCL 10 MG PO TABS: 10.0000 mg | ORAL_TABLET | Freq: Three times a day (TID) | ORAL | 5 refills | Status: AC | PRN

## 2017-01-26 MED ORDER — BUPROPION HCL ER (XL) 300 MG PO TB24: 300.0000 mg | ORAL_TABLET | Freq: Every day | ORAL | 1 refills | Status: DC

## 2017-01-26 NOTE — Progress Notes (Signed)
BP 137/78   Pulse (!) 103   Temp 98.2 F (36.8 C) (Oral)   Ht 5\' 4"  (1.626 m)   Wt 226 lb (102.5 kg)   BMI 38.79 kg/m    Subjective:    Patient ID: [name] [name], female    DOB: [birth year], 26 y.o.   MRN: [id number]  HPI: [name] [name] is a 26 y.o. female presenting on 01/26/2017 for Depression (pt here today for 4 week recheck)   HPI Anxiety and depression recheck Patient is coming in today for anxiety and depression recheck. She says she is doing well but not completely where she would like to be she still shouldn't having a lot of anxiety and she has test coming up soon so that is attributing to that but she just feels a lot more stressed. She denies any suicidal ideations or thoughts of yourself. She has been very happy with the medication and denies any major side effects and just thinks she might need a little bit more of it. She is also been using the hydroxyzine about every other day but has been working for her and doesn't make her too sedated. Depression screen [facility] 2/9 01/26/2017 12/22/2016 06/09/2016 03/31/2015  Decreased Interest 1 2 2  0  Down, Depressed, Hopeless 1 2 2  0  PHQ - 2 Score 2 4 4  0  Altered sleeping 3 3 3  -  Tired, decreased energy 1 3 3  -  Change in appetite 1 2 3  -  Feeling bad or failure about yourself  0 1 3 -  Trouble concentrating 2 3 2  -  Moving slowly or fidgety/restless 0 3 2 -  Suicidal thoughts 0 0 0 -  PHQ-9 Score 9 19 20  -  Difficult doing work/chores Somewhat difficult Extremely dIfficult - -    Hypothyroidism recheck Patient is coming in for thyroid recheck today as well. They deny any issues with hair changes or heat or cold problems or diarrhea or constipation. They deny any chest pain or palpitations. They are currently on levothyroxine 75 micrograms   Relevant past medical, surgical, family and social history reviewed and updated as indicated. Interim medical history since our last visit reviewed. Allergies and medications reviewed  and updated.  Review of Systems  Constitutional: Negative for chills, fatigue, fever and unexpected weight change.  Eyes: Negative for redness and visual disturbance.  Respiratory: Negative for chest tightness and shortness of breath.   Cardiovascular: Negative for chest pain and leg swelling.  Endocrine: Negative for cold intolerance and heat intolerance.  Musculoskeletal: Negative for back pain and gait problem.  Skin: Negative for rash.  Neurological: Negative for light-headedness and headaches.  Psychiatric/Behavioral: Positive for decreased concentration and dysphoric mood. Negative for agitation, behavioral problems, self-injury, sleep disturbance and suicidal ideas. The patient is nervous/anxious.   All other systems reviewed and are negative.   Per HPI unless specifically indicated above      Objective:    BP 137/78   Pulse (!) 103   Temp 98.2 F (36.8 C) (Oral)   Ht 5\' 4"  (1.626 m)   Wt 226 lb (102.5 kg)   BMI 38.79 kg/m   Wt Readings from Last 3 Encounters:  01/26/17 226 lb (102.5 kg)  12/22/16 225 lb (102.1 kg)  06/09/16 224 lb 8 oz (101.8 kg)    Physical Exam  Constitutional: She is oriented to person, place, and time. She appears well-developed and well-nourished. No distress.  Eyes: Conjunctivae are normal.  Neck: Neck supple. No thyromegaly present.  Cardiovascular: Normal rate, regular rhythm, normal heart sounds and intact distal pulses.   No murmur heard. Pulmonary/Chest: Effort normal and breath sounds normal. No respiratory distress. She has no wheezes. She has no rales.  Musculoskeletal: Normal range of motion. She exhibits no edema or tenderness.  Lymphadenopathy:    She has no cervical adenopathy.  Neurological: She is alert and oriented to person, place, and time. Coordination normal.  Skin: Skin is warm and dry. No rash noted. She is not diaphoretic.  Psychiatric: Her behavior is normal. Her mood appears anxious. She exhibits a depressed mood.  She expresses no suicidal ideation. She expresses no suicidal plans.  Nursing note and vitals reviewed.       Assessment & Plan:   Problem List Items Addressed This Visit      Endocrine   Hypothyroid - Primary   Relevant Orders   TSH     Other   Anxiety and depression   Relevant Medications   buPROPion (WELLBUTRIN XL) 300 MG 24 hr tablet   hydrOXYzine (ATARAX/VISTARIL) 25 MG tablet   Other Relevant Orders   TSH     We increased her Wellbutrin to 300 mg to see if it helped a little bit more.  Follow up plan: Return in about 3 months (around 04/28/2017), or if symptoms worsen or fail to improve, for Anxiety and depression recheck.  Counseling provided for all of the vaccine components Orders Placed This Encounter  Procedures  . TSH    Arville Care, MD [facility] 01/26/2017, 11:26 AM

## 2017-01-27 LAB — TSH: TSH: 4.84 u[IU]/mL — ABNORMAL HIGH (ref 0.450–4.500)

## 2017-01-29 ENCOUNTER — Other Ambulatory Visit: Payer: Self-pay | Admitting: *Deleted

## 2017-01-29 MED ORDER — LEVOTHYROXINE SODIUM 88 MCG PO TABS: 88.0000 ug | ORAL_TABLET | Freq: Every day | ORAL | 1 refills | Status: DC

## 2017-05-24 ENCOUNTER — Ambulatory Visit (INDEPENDENT_AMBULATORY_CARE_PROVIDER_SITE_OTHER): Payer: 59 | Admitting: Family

## 2017-05-24 VITALS — BP 148/88 | HR 114 | Temp 97.5°F

## 2017-05-24 DIAGNOSIS — F410 Panic disorder [episodic paroxysmal anxiety] without agoraphobia: Secondary | ICD-10-CM

## 2017-05-24 DIAGNOSIS — E039 Hypothyroidism, unspecified: Secondary | ICD-10-CM | POA: Diagnosis not present

## 2017-05-24 DIAGNOSIS — F419 Anxiety disorder, unspecified: Secondary | ICD-10-CM | POA: Diagnosis not present

## 2017-05-24 DIAGNOSIS — I10 Essential (primary) hypertension: Secondary | ICD-10-CM

## 2017-05-24 DIAGNOSIS — F329 Major depressive disorder, single episode, unspecified: Secondary | ICD-10-CM

## 2017-05-24 MED ORDER — BUPROPION HCL ER (XL) 300 MG PO TB24: 300.0000 mg | ORAL_TABLET | Freq: Every day | ORAL | 1 refills | Status: AC

## 2017-05-24 MED ORDER — LISINOPRIL 10 MG PO TABS: 10.0000 mg | ORAL_TABLET | Freq: Every day | ORAL | 3 refills | Status: AC

## 2017-05-24 MED ORDER — HYDROXYZINE PAMOATE 50 MG PO CAPS: 50.0000 mg | ORAL_CAPSULE | Freq: Three times a day (TID) | ORAL | 0 refills | Status: DC | PRN

## 2017-05-24 MED ORDER — ESCITALOPRAM OXALATE 10 MG PO TABS: 10.0000 mg | ORAL_TABLET | Freq: Every day | ORAL | 3 refills | Status: DC

## 2017-05-24 NOTE — Progress Notes (Signed)
Subjective:    Patient ID: [name] [name], female    DOB: 02/14/1991, 26 y.o.   MRN: [id number]  PT presents to the office today with HTN and anxiety. Pt states she checked her BP at work and it was 169/112 earlier in the week and states since then her anxiety has increased.  Pt states her husband was recently diagnosed with Bipolar and was in [facility] all weekend.  Hypertension  This is a new problem. The current episode started 1 to 4 weeks ago. The problem has been waxing and waning since onset. The problem is uncontrolled. Associated symptoms include anxiety and malaise/fatigue. Pertinent negatives include no peripheral edema. Past treatments include nothing. The current treatment provides no improvement. There is no history of kidney disease, CAD/MI, CVA or heart failure. Identifiable causes of hypertension include a thyroid problem.  Anxiety  Presents for follow-up visit. Symptoms include decreased concentration, excessive worry, insomnia, irritability, nervous/anxious behavior, panic and restlessness. Symptoms occur constantly. The severity of symptoms is moderate.    Thyroid Problem  Presents for follow-up visit. Symptoms include anxiety and fatigue. Patient reports no hoarse voice. The symptoms have been stable. There is no history of heart failure.      Review of Systems  Constitutional: Positive for fatigue, irritability and malaise/fatigue.  HENT: Negative for hoarse voice.   Psychiatric/Behavioral: Positive for decreased concentration. The patient is nervous/anxious, has insomnia and is hyperactive.   All other systems reviewed and are negative.      Objective:   Physical Exam  Constitutional: She is oriented to person, place, and time. She appears well-developed and well-nourished. No distress.  HENT:  Head: Normocephalic.  Eyes: Pupils are equal, round, and reactive to light.  Neck: Normal range of motion. Neck supple. No thyromegaly present.    Cardiovascular: Normal rate, regular rhythm, normal heart sounds and intact distal pulses.  No murmur heard. Pulmonary/Chest: Effort normal and breath sounds normal. No respiratory distress. She has no wheezes.  Abdominal: Soft. Bowel sounds are normal. She exhibits no distension. There is no tenderness.  Musculoskeletal: Normal range of motion. She exhibits no edema or tenderness.  Neurological: She is alert and oriented to person, place, and time.  Skin: Skin is warm and dry.  Psychiatric: Judgment and thought content normal. Her mood appears anxious. She is hyperactive.  Vitals reviewed.    BP (!) 148/88   Pulse (!) 114   Temp (!) 97.5 F (36.4 C) (Oral)      Assessment & Plan:  1. Anxiety and depression - buPROPion (WELLBUTRIN XL) 300 MG 24 hr tablet; Take 1 tablet (300 mg total) daily by mouth.  Dispense: 90 tablet; Refill: 1 - escitalopram (LEXAPRO) 10 MG tablet; Take 1 tablet (10 mg total) daily by mouth.  Dispense: 90 tablet; Refill: 3 - hydrOXYzine (VISTARIL) 50 MG capsule; Take 1 capsule (50 mg total) 3 (three) times daily as needed by mouth.  Dispense: 30 capsule; Refill: 0 - BMP8+EGFR - CBC with Differential/Platelet  2. Panic attack - buPROPion (WELLBUTRIN XL) 300 MG 24 hr tablet; Take 1 tablet (300 mg total) daily by mouth.  Dispense: 90 tablet; Refill: 1 - escitalopram (LEXAPRO) 10 MG tablet; Take 1 tablet (10 mg total) daily by mouth.  Dispense: 90 tablet; Refill: 3 - hydrOXYzine (VISTARIL) 50 MG capsule; Take 1 capsule (50 mg total) 3 (three) times daily as needed by mouth.  Dispense: 30 capsule; Refill: 0 - BMP8+EGFR - CBC with Differential/Platelet  3. Hypertension, unspecified type - lisinopril (  PRINIVIL,ZESTRIL) 10 MG tablet; Take 1 tablet (10 mg total) daily by mouth.  Dispense: 90 tablet; Refill: 3 - BMP8+EGFR - CBC with Differential/Platelet  4. Hypothyroidism, unspecified type - TSH - BMP8+EGFR - CBC with Differential/Platelet  Will reorder  Wellbutrin today  Start Lexapro 10 mg today  Increase Vistaril to 50 mg from 25 mg TID prn  Labs pending Continue with Behavorial Therapy  Stress management discussed  RTO in 6 weeks with PCP   Evelina Dun, FNP

## 2017-05-24 NOTE — Patient Instructions (Signed)

## 2017-05-25 LAB — CBC WITH DIFFERENTIAL/PLATELET
Basophils Absolute: 0 10*3/uL (ref 0.0–0.2)
Basos: 0 %
EOS (ABSOLUTE): 0.3 10*3/uL (ref 0.0–0.4)
EOS: 3 %
HEMATOCRIT: 41.2 % (ref 34.0–46.6)
HEMOGLOBIN: 13.8 g/dL (ref 11.1–15.9)
IMMATURE GRANULOCYTES: 0 %
Immature Grans (Abs): 0 10*3/uL (ref 0.0–0.1)
LYMPHS ABS: 2.6 10*3/uL (ref 0.7–3.1)
Lymphs: 32 %
MCH: 29 pg (ref 26.6–33.0)
MCHC: 33.5 g/dL (ref 31.5–35.7)
MCV: 87 fL (ref 79–97)
MONOCYTES: 6 %
Monocytes Absolute: 0.5 10*3/uL (ref 0.1–0.9)
Neutrophils Absolute: 4.7 10*3/uL (ref 1.4–7.0)
Neutrophils: 59 %
Platelets: 380 10*3/uL — ABNORMAL HIGH (ref 150–379)
RBC: 4.76 x10E6/uL (ref 3.77–5.28)
RDW: 13.4 % (ref 12.3–15.4)
WBC: 8.1 10*3/uL (ref 3.4–10.8)

## 2017-05-25 LAB — BMP8+EGFR
BUN / CREAT RATIO: 8 — AB (ref 9–23)
BUN: 8 mg/dL (ref 6–20)
CO2: 24 mmol/L (ref 20–29)
CREATININE: 0.97 mg/dL (ref 0.57–1.00)
Calcium: 9.3 mg/dL (ref 8.7–10.2)
Chloride: 101 mmol/L (ref 96–106)
GFR calc Af Amer: 93 mL/min/{1.73_m2} (ref >59)
GFR calc non Af Amer: 80 mL/min/{1.73_m2} (ref >59)
GLUCOSE: 109 mg/dL — AB (ref 65–99)
Potassium: 4.3 mmol/L (ref 3.5–5.2)
Sodium: 139 mmol/L (ref 134–144)

## 2017-05-25 LAB — TSH: TSH: 2.58 u[IU]/mL (ref 0.450–4.500)

## 2017-05-30 ENCOUNTER — Encounter: Payer: Self-pay | Admitting: Family Medicine

## 2017-05-31 ENCOUNTER — Ambulatory Visit: Payer: Self-pay | Admitting: Family Medicine

## 2017-06-27 ENCOUNTER — Other Ambulatory Visit: Payer: Self-pay | Admitting: *Deleted

## 2017-06-27 DIAGNOSIS — F329 Major depressive disorder, single episode, unspecified: Secondary | ICD-10-CM

## 2017-06-27 DIAGNOSIS — F419 Anxiety disorder, unspecified: Principal | ICD-10-CM

## 2017-06-27 DIAGNOSIS — F410 Panic disorder [episodic paroxysmal anxiety] without agoraphobia: Secondary | ICD-10-CM

## 2017-06-27 MED ORDER — HYDROXYZINE PAMOATE 50 MG PO CAPS: 50.0000 mg | ORAL_CAPSULE | Freq: Three times a day (TID) | ORAL | 3 refills | Status: AC | PRN

## 2017-06-27 MED FILL — LEVOTHYROXINE 88 MCG TABLET: 88 | 90 days supply | Qty: 90 | Fill #0

## 2017-06-27 MED FILL — NUVARING VAGINAL RING: 0.12-0.015 | 84 days supply | Qty: 3 | Fill #0

## 2017-06-27 MED FILL — HYDROXYZINE PAM 25 MG CAP: 25 | 10 days supply | Qty: 60 | Fill #0

## 2017-06-27 MED FILL — BUPROPION HCL XL 300 MG TAB: 300 | 90 days supply | Qty: 90 | Fill #0

## 2017-06-27 MED FILL — ESCITALOPRAM 10 MG TABLET: 10 | 90 days supply | Qty: 90 | Fill #0

## 2017-06-27 NOTE — Progress Notes (Signed)
Go ahead and send 3 months worth of the hydroxyzine

## 2017-06-27 NOTE — Progress Notes (Signed)
Please review and advise on refill. 

## 2017-06-27 NOTE — Progress Notes (Signed)
Refills were [facility].

## 2017-07-04 DIAGNOSIS — H5213 Myopia, bilateral: Secondary | ICD-10-CM | POA: Diagnosis not present

## 2017-07-19 ENCOUNTER — Ambulatory Visit: Payer: Self-pay | Admitting: Family Medicine

## 2017-08-13 ENCOUNTER — Ambulatory Visit: Payer: 59 | Admitting: Family Medicine

## 2017-08-13 ENCOUNTER — Encounter: Payer: Self-pay | Admitting: Family Medicine

## 2017-08-13 VITALS — BP 127/80 | HR 86 | Temp 98.6°F | Ht 64.0 in | Wt 226.0 lb

## 2017-08-13 DIAGNOSIS — F339 Major depressive disorder, recurrent, unspecified: Secondary | ICD-10-CM

## 2017-08-13 DIAGNOSIS — F411 Generalized anxiety disorder: Secondary | ICD-10-CM | POA: Diagnosis not present

## 2017-08-13 DIAGNOSIS — R7309 Other abnormal glucose: Secondary | ICD-10-CM | POA: Diagnosis not present

## 2017-08-13 DIAGNOSIS — E039 Hypothyroidism, unspecified: Secondary | ICD-10-CM | POA: Diagnosis not present

## 2017-08-13 LAB — BAYER DCA HB A1C WAIVED: HB A1C (BAYER DCA - WAIVED): 5.2 % (ref <7.0)

## 2017-08-13 NOTE — Progress Notes (Signed)
BP 127/80   Pulse 86   Temp 98.6 F (37 C) (Oral)   Ht 5\' 4"  (1.626 m)   Wt 226 lb (102.5 kg)   BMI 38.79 kg/m    Subjective:    Patient ID: [name] [name], female    DOB: [birth year], 27 y.o.   MRN: [id number]  HPI: [name] [name] is a 27 y.o. female presenting on 08/13/2017 for Hypertension (follow up; stopped Lisinopril because BP was normal and she did not like how it made her feel) and Anxiety/depression (6 week follow up)   HPI Hypertension Patient is currently on no medication and is better controlled since her anxiety has been better controlled, and their blood pressure today is 127/80. Patient denies any lightheadedness or dizziness. Patient denies headaches, blurred vision, chest pains, shortness of breath, or weakness. Denies any side effects from medication and is content with current medication.   Anxiety and depression recheck Patient is coming in for anxiety and depression recheck.  She is currently on Lexapro and Wellbutrin and says that she is doing very well on those medications and is very happy and feels like her job is going a lot better.  She denies any suicidal ideations or thoughts of hurting herself.  She denies any major feelings of anxiety or depression and says in general she is just feeling a lot better [facility] 08/13/2017 01/26/2017 12/22/2016 06/09/2016 03/31/2015  Decreased Interest 0 1 2 2  0  Down, Depressed, Hopeless 0 1 2 2  0  PHQ - 2 Score 0 2 4 4  0  Altered sleeping - 3 3 3  -  Tired, decreased energy - 1 3 3  -  Change in appetite - 1 2 3  -  Feeling bad or failure about yourself  - 0 1 3 -  Trouble concentrating - 2 3 2  -  Moving slowly or fidgety/restless - 0 3 2 -  Suicidal thoughts - 0 0 0 -  PHQ-9 Score - 9 19 20  -  Difficult doing work/chores - Somewhat difficult Extremely dIfficult - -    Hypothyroidism recheck Patient is coming in for thyroid recheck today as well. They deny any issues with hair changes or heat or cold  problems or diarrhea or constipation. They deny any chest pain or palpitations. They are currently on levothyroxine 88 micrograms   Relevant past medical, surgical, family and social history reviewed and updated as indicated. Interim medical history since our last visit reviewed. Allergies and medications reviewed and updated.  Review of Systems  Constitutional: Positive for fatigue. Negative for chills and fever.  Eyes: Negative for visual disturbance.  Respiratory: Negative for chest tightness and shortness of breath.   Cardiovascular: Negative for chest pain and leg swelling.  Genitourinary: Negative for difficulty urinating and dysuria.  Musculoskeletal: Negative for back pain and gait problem.  Skin: Negative for rash.  Neurological: Negative for light-headedness and headaches.  Psychiatric/Behavioral: Negative for agitation, behavioral problems, dysphoric mood, self-injury, sleep disturbance and suicidal ideas. The patient is not nervous/anxious.   All other systems reviewed and are negative.   Per HPI unless specifically indicated above   Allergies as of 08/13/2017      Reactions   Celexa [citalopram Hydrobromide] Diarrhea, Other (See Comments)   Pt states that this medication caused her limbs to go numb.    Ranitidine Diarrhea, Other (See Comments)   Pt states that this causes severe stomach pain.    Topamax [topiramate] Nausea And Vomiting, Other (See Comments)   Pt  states that this medication caused seizures and she was unable to speak.       Medication List        Accurate as of 08/13/17  2:28 PM. Always use your most recent med list.          albuterol 108 (90 Base) MCG/ACT inhaler Commonly known as:  PROVENTIL HFA;VENTOLIN HFA Inhale 2 puffs into the lungs every 6 (six) hours as needed for wheezing or shortness of breath.   ALEVE 220 MG Caps Generic drug:  Naproxen Sodium Take 220 mg by mouth 2 (two) times daily.   buPROPion 300 MG 24 hr tablet Commonly  known as:  WELLBUTRIN XL Take 1 tablet (300 mg total) daily by mouth.   cyclobenzaprine 10 MG tablet Commonly known as:  FLEXERIL Take 1 tablet (10 mg total) by mouth 3 (three) times daily as needed for muscle spasms.   escitalopram 10 MG tablet Commonly known as:  LEXAPRO Take 1 tablet (10 mg total) daily by mouth.   etonogestrel-ethinyl estradiol 0.12-0.015 MG/24HR vaginal ring Commonly known as:  NUVARING Insert vaginally and leave in place for 3 consecutive weeks, then remove for 1 week.   hydrOXYzine 50 MG capsule Commonly known as:  VISTARIL Take 1 capsule (50 mg total) by mouth 3 (three) times daily as needed.   levothyroxine 88 MCG tablet Commonly known as:  SYNTHROID, LEVOTHROID Take 1 tablet (88 mcg total) by mouth daily.   lisinopril 10 MG tablet Commonly known as:  PRINIVIL,ZESTRIL Take 1 tablet (10 mg total) daily by mouth.          Objective:    BP 127/80   Pulse 86   Temp 98.6 F (37 C) (Oral)   Ht 5\' 4"  (1.626 m)   Wt 226 lb (102.5 kg)   BMI 38.79 kg/m   Wt Readings from Last 3 Encounters:  08/13/17 226 lb (102.5 kg)  01/26/17 226 lb (102.5 kg)  12/22/16 225 lb (102.1 kg)    Physical Exam  Constitutional: She is oriented to person, place, and time. She appears well-developed and well-nourished. No distress.  Eyes: Conjunctivae are normal.  Neck: Neck supple. No thyromegaly present.  Cardiovascular: Normal rate, regular rhythm, normal heart sounds and intact distal pulses.  No murmur heard. Pulmonary/Chest: Effort normal and breath sounds normal. No respiratory distress. She has no wheezes. She has no rales.  Musculoskeletal: Normal range of motion. She exhibits no edema or tenderness.  Lymphadenopathy:    She has no cervical adenopathy.  Neurological: She is alert and oriented to person, place, and time. Coordination normal.  Skin: Skin is warm and dry. No rash noted. She is not diaphoretic.  Psychiatric: She has a normal mood and affect. Her  behavior is normal. Judgment normal. Her mood appears not anxious. She does not exhibit a depressed mood. She expresses no suicidal ideation. She expresses no suicidal plans.  Nursing note and vitals reviewed.       Assessment & Plan:   Problem List Items Addressed This Visit      Endocrine   Hypothyroid   Relevant Orders   TSH   Anemia Profile B     Other   Depression, recurrent (HCC) - Primary   Relevant Orders   TSH   Anemia Profile B   GAD (generalized anxiety disorder)   Relevant Orders   TSH   Anemia Profile B    Other Visit Diagnoses    Elevated glucose       Relevant  Orders   Bayer DCA Hb A1c Waived       Follow up plan: Return in about 6 months (around 02/10/2018), or if symptoms worsen or fail to improve, for Thyroid recheck and depression recheck.  Counseling provided for all of the vaccine components Orders Placed This Encounter  Procedures  . TSH  . Bayer DCA Hb A1c Waived  . Anemia Profile B    Arville Care, MD [facility] 08/13/2017, 2:28 PM

## 2017-08-14 ENCOUNTER — Other Ambulatory Visit: Payer: Self-pay | Admitting: *Deleted

## 2017-08-14 DIAGNOSIS — E039 Hypothyroidism, unspecified: Secondary | ICD-10-CM

## 2017-08-14 LAB — TSH: TSH: 9.99 u[IU]/mL — ABNORMAL HIGH (ref 0.450–4.500)

## 2017-08-14 LAB — ANEMIA PROFILE B
BASOS: 0 %
Basophils Absolute: 0 10*3/uL (ref 0.0–0.2)
EOS (ABSOLUTE): 0.2 10*3/uL (ref 0.0–0.4)
Eos: 2 %
FERRITIN: 174 ng/mL — AB (ref 15–150)
Folate: 18.2 ng/mL (ref >3.0)
HEMATOCRIT: 37.9 % (ref 34.0–46.6)
HEMOGLOBIN: 12.8 g/dL (ref 11.1–15.9)
IMMATURE GRANS (ABS): 0 10*3/uL (ref 0.0–0.1)
Immature Granulocytes: 0 %
Iron Saturation: 16 % (ref 15–55)
Iron: 59 ug/dL (ref 27–159)
LYMPHS: 32 %
Lymphocytes Absolute: 2.6 10*3/uL (ref 0.7–3.1)
MCH: 28.6 pg (ref 26.6–33.0)
MCHC: 33.8 g/dL (ref 31.5–35.7)
MCV: 85 fL (ref 79–97)
MONOS ABS: 0.5 10*3/uL (ref 0.1–0.9)
Monocytes: 7 %
NEUTROS ABS: 5 10*3/uL (ref 1.4–7.0)
Neutrophils: 59 %
Platelets: 370 10*3/uL (ref 150–379)
RBC: 4.48 x10E6/uL (ref 3.77–5.28)
RDW: 14.6 % (ref 12.3–15.4)
Retic Ct Pct: 2.2 % (ref 0.6–2.6)
Total Iron Binding Capacity: 374 ug/dL (ref 250–450)
UIBC: 315 ug/dL (ref 131–425)
VITAMIN B 12: 455 pg/mL (ref 232–1245)
WBC: 8.3 10*3/uL (ref 3.4–10.8)

## 2017-08-14 MED ORDER — LEVOTHYROXINE SODIUM 100 MCG PO TABS: 100.0000 ug | ORAL_TABLET | Freq: Every day | ORAL | 3 refills | Status: AC

## 2017-08-14 MED FILL — LEVOTHYROXINE 100 MCG TABLE: 100 | 90 days supply | Qty: 90 | Fill #0 | Status: TO

## 2017-10-08 ENCOUNTER — Telehealth: Payer: Self-pay | Admitting: Family Medicine

## 2017-10-08 ENCOUNTER — Telehealth: Payer: Self-pay

## 2017-10-08 NOTE — Telephone Encounter (Signed)
Referral is doing the referral

## 2017-10-08 NOTE — Telephone Encounter (Signed)
Okay go ahead and put the referral back in

## 2017-10-08 NOTE — Telephone Encounter (Signed)
Patient needs a new referral for [facility]   Dr Kathlen ModyAriel

## 2017-10-17 ENCOUNTER — Encounter: Payer: Self-pay | Admitting: Family Medicine

## 2017-10-18 MED FILL — ESCITALOPRAM 10 MG TABLET: 10 | 90 days supply | Qty: 90 | Fill #1

## 2017-10-22 MED FILL — NUVARING VAGINAL RING: 0.12-0.015 | 84 days supply | Qty: 3 | Fill #1

## 2017-10-24 DIAGNOSIS — M79671 Pain in right foot: Secondary | ICD-10-CM | POA: Diagnosis not present

## 2017-10-24 DIAGNOSIS — M79643 Pain in unspecified hand: Secondary | ICD-10-CM | POA: Diagnosis not present

## 2017-10-24 DIAGNOSIS — D8989 Other specified disorders involving the immune mechanism, not elsewhere classified: Secondary | ICD-10-CM | POA: Diagnosis not present

## 2017-10-24 DIAGNOSIS — M791 Myalgia, unspecified site: Secondary | ICD-10-CM | POA: Diagnosis not present

## 2017-10-24 DIAGNOSIS — M79642 Pain in left hand: Secondary | ICD-10-CM | POA: Diagnosis not present

## 2017-10-24 DIAGNOSIS — M79641 Pain in right hand: Secondary | ICD-10-CM | POA: Diagnosis not present

## 2017-10-24 DIAGNOSIS — K121 Other forms of stomatitis: Secondary | ICD-10-CM | POA: Diagnosis not present

## 2017-10-24 DIAGNOSIS — R21 Rash and other nonspecific skin eruption: Secondary | ICD-10-CM | POA: Diagnosis not present

## 2017-10-24 DIAGNOSIS — M359 Systemic involvement of connective tissue, unspecified: Secondary | ICD-10-CM | POA: Diagnosis not present

## 2017-10-24 DIAGNOSIS — M79673 Pain in unspecified foot: Secondary | ICD-10-CM | POA: Diagnosis not present

## 2017-10-24 DIAGNOSIS — M79672 Pain in left foot: Secondary | ICD-10-CM | POA: Diagnosis not present

## 2017-10-24 DIAGNOSIS — M2550 Pain in unspecified joint: Secondary | ICD-10-CM | POA: Diagnosis not present

## 2017-10-24 DIAGNOSIS — M797 Fibromyalgia: Secondary | ICD-10-CM | POA: Diagnosis not present

## 2017-10-24 DIAGNOSIS — R768 Other specified abnormal immunological findings in serum: Secondary | ICD-10-CM | POA: Diagnosis not present

## 2017-10-31 ENCOUNTER — Other Ambulatory Visit: Payer: Self-pay | Admitting: Family

## 2017-10-31 DIAGNOSIS — F329 Major depressive disorder, single episode, unspecified: Secondary | ICD-10-CM

## 2017-10-31 DIAGNOSIS — F410 Panic disorder [episodic paroxysmal anxiety] without agoraphobia: Secondary | ICD-10-CM

## 2017-10-31 DIAGNOSIS — F419 Anxiety disorder, unspecified: Principal | ICD-10-CM

## 2018-01-18 ENCOUNTER — Ambulatory Visit: Payer: Managed Care, Other (non HMO) | Admitting: Family Medicine

## 2018-01-22 ENCOUNTER — Encounter: Payer: Self-pay | Admitting: Family Medicine

## 2018-02-11 ENCOUNTER — Ambulatory Visit: Payer: 59 | Admitting: Family Medicine

## 2018-03-27 ENCOUNTER — Other Ambulatory Visit: Payer: Self-pay | Admitting: Family

## 2018-03-27 DIAGNOSIS — F32A Depression, unspecified: Secondary | ICD-10-CM

## 2018-03-27 DIAGNOSIS — F329 Major depressive disorder, single episode, unspecified: Secondary | ICD-10-CM

## 2018-03-27 DIAGNOSIS — F419 Anxiety disorder, unspecified: Principal | ICD-10-CM

## 2018-03-27 DIAGNOSIS — F410 Panic disorder [episodic paroxysmal anxiety] without agoraphobia: Secondary | ICD-10-CM

## 2018-04-08 ENCOUNTER — Telehealth: Payer: Managed Care, Other (non HMO) | Admitting: Family

## 2018-04-08 DIAGNOSIS — J329 Chronic sinusitis, unspecified: Secondary | ICD-10-CM

## 2018-04-08 DIAGNOSIS — B9789 Other viral agents as the cause of diseases classified elsewhere: Secondary | ICD-10-CM

## 2018-04-08 MED ORDER — FLUTICASONE PROPIONATE 50 MCG/ACT NA SUSP: 2.0000 | Freq: Every day | NASAL | 6 refills | Status: AC

## 2018-04-08 NOTE — Progress Notes (Signed)

## 2019-01-24 ENCOUNTER — Telehealth: Payer: Managed Care, Other (non HMO) | Admitting: Nurse Practitioner

## 2019-01-24 DIAGNOSIS — N3000 Acute cystitis without hematuria: Secondary | ICD-10-CM

## 2019-01-24 NOTE — Progress Notes (Signed)
Based on what you shared with me it looks like you have uti or pyelonephritis due to associated back pain,that should be evaluated in a face to face office visit. You need to have a urinalysis and urine culture.   NOTE: If you entered your credit card information for this eVisit, you will not be charged. You may see a "hold" on your card for the $30 but that hold will drop off and you will not have a charge processed.  If you are having a true medical emergency please call 911.  If you need an urgent face to face visit, [place] has four urgent care centers for your convenience.  If you need care fast and have a high deductible or no insurance consider:   [email or web address] to reserve your spot online an avoid wait times  [facility] [address], [address] [place], [place] [address] 8 am to 8 pm Monday-Friday 10 am to 4 pm Saturday-Sunday *Across the street from [employer]  [place], [address] 8 am to 5 pm Monday-Friday * In the [facility] on the [facility]   The following sites will take your  insurance:  . [facility] [place] a Provider at this Location  [address] [place], [place] [address] . 10 am to 8 pm Monday-Friday . 12 pm to 8 pm Saturday-Sunday   . [facility] at [place] a Provider at this Location  [address], [place], [place] [address] . 8 am to 8 pm Monday-Friday . 9 am to 6 pm Saturday . 11 am to 6 pm Sunday   . [facility] at [place] Get Driving Directions  [address] [place], [place] [address] . 8 am to 8 pm Monday-Friday . 8 am to 4 pm Saturday-Sunday   Your e-visit answers were reviewed by a board certified advanced clinical practitioner to complete your personal care plan.

## 2020-01-20 ENCOUNTER — Telehealth: Payer: Managed Care, Other (non HMO) | Admitting: Emergency Medicine

## 2020-01-20 DIAGNOSIS — R059 Cough, unspecified: Secondary | ICD-10-CM

## 2020-01-20 DIAGNOSIS — R05 Cough: Secondary | ICD-10-CM

## 2020-01-20 MED ORDER — BENZONATATE 100 MG PO CAPS: 100.0000 mg | ORAL_CAPSULE | Freq: Three times a day (TID) | ORAL | 0 refills | Status: AC | PRN

## 2020-01-20 MED ORDER — METHYLPREDNISOLONE 4 MG PO TBPK: ORAL_TABLET | ORAL | 0 refills | Status: AC

## 2020-01-20 NOTE — Progress Notes (Signed)
We are sorry that you are not feeling well.  Here is how we plan to help!  Based on your presentation I believe you most likely have A cough due to allergies.  I recommend that you start the an over-the counter-allergy medication such as Claritin 10 mg or Zyrtec 10 mg daily.     In addition you may use A prescription cough medication called Tessalon Perles 100mg . You may take 1-2 capsules every 8 hours as needed for your cough.   A medrol doespak  From your responses in the eVisit questionnaire you describe inflammation in the upper respiratory tract which is causing a significant cough.  This is commonly called Bronchitis and has four common causes:    Allergies  Viral Infections  Acid Reflux  Bacterial Infection Allergies, viruses and acid reflux are treated by controlling symptoms or eliminating the cause. An example might be a cough caused by taking certain blood pressure medications. You stop the cough by changing the medication. Another example might be a cough caused by acid reflux. Controlling the reflux helps control the cough.  USE OF BRONCHODILATOR ("RESCUE") INHALERS: There is a risk from using your bronchodilator too frequently.  The risk is that over-reliance on a medication which only relaxes the muscles surrounding the breathing tubes can reduce the effectiveness of medications prescribed to reduce swelling and congestion of the tubes themselves.  Although you feel brief relief from the bronchodilator inhaler, your asthma may actually be worsening with the tubes becoming more swollen and filled with mucus.  This can delay other crucial treatments, such as oral steroid medications. If you need to use a bronchodilator inhaler daily, several times per day, you should discuss this with your provider.  There are probably better treatments that could be used to keep your asthma under control.     HOME CARE . Only take medications as instructed by your medical team. . Complete the  entire course of an antibiotic. . Drink plenty of fluids and get plenty of rest. . Avoid close contacts especially the very young and the elderly . Cover your mouth if you cough or cough into your sleeve. . Always remember to wash your hands . A steam or ultrasonic humidifier can help congestion.   GET HELP RIGHT AWAY IF: . You develop worsening fever. . You become short of breath . You cough up blood. . Your symptoms persist after you have completed your treatment plan MAKE SURE YOU   Understand these instructions.  Will watch your condition.  Will get help right away if you are not doing well or get worse.  Your e-visit answers were reviewed by a board certified advanced clinical practitioner to complete your personal care plan.  Depending on the condition, your plan could have included both over the counter or prescription medications. If there is a problem please reply  once you have received a response from your provider. Your safety is important to .  If you have drug allergies check your prescription carefully.    You can use MyChart to ask questions about today's visit, request a non-urgent call back, or ask for a work or school excuse for 24 hours related to this e-Visit. If it has been greater than 24 hours you will need to follow up with your provider, or enter a new e-Visit to address those concerns. You will get an e-mail in the next two days asking about your experience.  I hope that your e-visit has been valuable and will  speed your recovery. Thank you for using e-visits. **Please do not respond to this message unless you have follow up questions.** Greater than 5 but less than 10 minutes spent researching, coordinating, and implementing care for this patient today

## 2020-12-28 ENCOUNTER — Encounter: Payer: Self-pay | Admitting: Physical Therapy

## 2020-12-28 ENCOUNTER — Ambulatory Visit: Payer: Managed Care, Other (non HMO) | Attending: Family Medicine | Admitting: Physical Therapy

## 2020-12-28 ENCOUNTER — Other Ambulatory Visit: Payer: Self-pay

## 2020-12-28 DIAGNOSIS — M5442 Lumbago with sciatica, left side: Secondary | ICD-10-CM

## 2020-12-28 DIAGNOSIS — G8929 Other chronic pain: Secondary | ICD-10-CM | POA: Diagnosis present

## 2020-12-28 DIAGNOSIS — M5441 Lumbago with sciatica, right side: Secondary | ICD-10-CM | POA: Insufficient documentation

## 2020-12-28 NOTE — Therapy (Signed)
[facility] [address] [place], [place], [address] Phone: [phone]   Fax:  [phone]  Physical Therapy Evaluation  Patient Details  Name: [name] [name] MRN: [id number] Date of Birth: 1990-09-20 Referring Provider (PT): Irena Reichmann MD   Encounter Date: 12/28/2020   PT End of Session - 12/28/20 1558     Visit Number 1    Number of Visits 8    Date for PT Re-Evaluation 01/25/21    PT Start Time 0230    PT Stop Time 0320    PT Time Calculation (min) 50 min    Activity Tolerance Patient tolerated treatment well    Behavior During Therapy [facility] for tasks assessed/performed             Past Medical History:  Diagnosis Date   Anxiety    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    Subjective Assessment - 12/28/20 1507     Subjective COVID-19 screen performed prior to patient entering clinic.  The patient presents to the clinic with a long h/o low back pain dating back to about the age of 30.  She essentially experiences pain over sides of her neck and into her UE and her lower back and down down LE's though her CC today is pain into her right LE.  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  Her pain is rated at a 6/10 today and can rise to much higher levels with increased activity. She has foumd massage therapy to be helpful.    Pertinent History H/o headaches, Lupus, connective tissue disease, HTN.    How long can you sit  comfortably? Varies.    How long can you stand comfortably? Varies.    How long can you walk comfortably? Varies.    Patient Stated Goals Would be nice to enjoy life more such as going on hikes.    Currently in Pain? Yes    Pain Score 6     Pain Location Back    Pain Orientation Right;Left    Pain Descriptors / Indicators Aching;Sharp;Shooting;Tingling;Sore    Pain Type Chronic pain    Pain Onset More than a month ago    Pain Frequency Constant    Aggravating Factors  See above.    Pain Relieving Factors See above.                [facility] PT Assessment - 12/28/20 0001       Assessment   Medical Diagnosis Right lumbar radiculopathy.    Referring Provider (PT) Irena Reichmann MD    Onset Date/Surgical Date --   Since 30 years of age.     Precautions   Precautions None      Restrictions   Weight Bearing Restrictions No      Balance Screen   Has the patient fallen in the past 6 months No  Has the patient had a decrease in activity level because of a fear of falling?  No    Is the patient reluctant to leave their home because of a fear of falling?  No      Home [employer]ist information centre manager residence      Prior Function   Level of Independence Independent      Posture/Postural Control   Posture/Postural Control No significant limitations      Deep Tendon Reflexes   DTR Assessment Site Patella;Achilles    Patella DTR 1+    Achilles DTR --   RT is 1+/4+ and left is 2+/4+.     ROM / Strength   AROM / PROM / Strength AROM;Strength      AROM   Overall AROM Comments Active lumbar flexion is limited by 50% and extension is full.      Strength   Overall Strength Comments Normal LE strength.      Palpation   Palpation comment Patient c/o diffuse palpable erector spinae discomfort from her mid-thoracic to lumbar musculature.      Special Tests   Other special tests Equal leg lengths.  (+) Right SLR.      Ambulation/Gait   Gait Comments The patient gait  was antalgic in nature today as she stated as she walked from the waiting room to treatment area that as soon as she stood she experienced right LE pain.                        Objective measurements completed on examination: See above findings.       [facility] Adult PT Treatment/Exercise - 12/28/20 0001       Modalities   Modalities Moist Heat;Electrical Stimulation      Moist Heat Therapy   Number Minutes Moist Heat 20 Minutes    Moist Heat Location Lumbar Spine      Electrical Stimulation   Electrical Stimulation Location Bilateral lumbar region.    Electrical Stimulation Action IFC at 80-150 Hz.    Electrical Stimulation Parameters 40% scan x 20 minutes.    Electrical Stimulation Goals Pain;Tone                         PT Long Term Goals - 12/28/20 1644       PT LONG TERM GOAL #1   Title Independent with a HEP.    Time 4    Period Weeks    Status New      PT LONG TERM GOAL #2   Title Perform ADL's with pain not > 4/10.    Time 4    Period Weeks    Status New      PT LONG TERM GOAL #3   Title Eliminate LE symptoms.    Time 4    Period Weeks    Status New                    Plan - 12/28/20 1554     Clinical Impression Statement The patient presents to O[facility] with a long history of spinal pain.  Her CC today is pain radiating into her right LE.  She demonstrates a positive right SLR and her right Achilles DTR is decreased when compared to left.  She c/o diffuse spinal pain.  Her functional mobility is impaired but she states she pushes through pain often.  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  Patient will benefit from skilled physical therapy intervention to address pain and deficits.    Personal Factors and Comorbidities Comorbidity 1;Comorbidity 2;Other    Comorbidities H/o headaches, Lupus, connective tissue disease, HTN.    Examination-Activity Limitations Locomotion Level;Other     Examination-Participation Restrictions Other    Stability/Clinical Decision Making Evolving/Moderate complexity    Clinical Decision Making Low    Rehab Potential Fair    PT Frequency 2x / week    PT Duration 4 weeks    PT Treatment/Interventions ADLs/Self Care Home Management;Cryotherapy;Electrical Stimulation;Ultrasound;Moist Heat;Therapeutic activities;Therapeutic exercise;Manual techniques;Patient/family education;Passive range of motion    PT Next Visit Plan STW/M, core exercise progression, modalities as needed.    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 [place], [place], [address] Phone: [phone]   Fax:  [phone]  Name: [name] [name] MRN: [id number] Date of Birth: 26-Nov-1990

## 2020-12-29 ENCOUNTER — Other Ambulatory Visit: Payer: Self-pay

## 2020-12-29 ENCOUNTER — Ambulatory Visit: Payer: Managed Care, Other (non HMO) | Admitting: Physical Therapy

## 2020-12-29 DIAGNOSIS — G8929 Other chronic pain: Secondary | ICD-10-CM

## 2020-12-29 DIAGNOSIS — M5442 Lumbago with sciatica, left side: Secondary | ICD-10-CM | POA: Diagnosis not present

## 2020-12-29 NOTE — Therapy (Signed)
PT Long Term Goals - 12/28/20 1644       PT LONG TERM GOAL #1   Title Independent with a HEP.    Time 4    Period Weeks    Status New      PT LONG TERM GOAL #2   Title Perform ADL's with pain not > 4/10.    Time 4    Period Weeks    Status New      PT LONG TERM GOAL #3   Title Eliminate LE symptoms.    Time 4    Period Weeks    Status New                   Plan - 12/29/20 1342     Clinical Impression Statement Patient arrived with a lot of pain in right low back and other areas. Patient unable to perform any exercises today. Today focused on manual STW to decrease pain and tone followed by modalities to decrease pain. Educated patient on posture awareness techniques with HEP provided. Patient has tighntess in low back and glut muscles and only minimal pressure with manual tharapy due to pain. Patient current goals ongoing due to pain deficts.    Personal Factors and Comorbidities  Comorbidity 1;Comorbidity 2;Other    Comorbidities H/o headaches, Lupus, connective tissue disease, HTN.    Examination-Activity Limitations Locomotion Level;Other    Examination-Participation Restrictions Other    Stability/Clinical Decision Making Evolving/Moderate complexity    Rehab Potential Fair    PT Frequency 2x / week    PT Duration 4 weeks    PT Treatment/Interventions ADLs/Self Care Home Management;Cryotherapy;Electrical Stimulation;Ultrasound;Moist Heat;Therapeutic activities;Therapeutic exercise;Manual techniques;Patient/family education;Passive range of motion    PT Next Visit Plan cont with POC for STW/M, core exercise progression, modalities as needed.    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   Lupus (systemic lupus erythematosus) (HCC) 10/14/2013   Weight gain 08/15/2013   Depression, recurrent (HCC)    Vitamin D deficiency    Unspecified vitamin D deficiency 07/14/2013   Hypothyroid 07/14/2013    Yareli Carthen P, PTA 12/29/2020, 1:48 PM  [facility] [address] [place], [place], [address] Phone: [phone]   Fax:  [phone]  Name: Floy Angert MRN: [id number] Date of Birth: [birth year]  [facility] [place], [place], [address] Phone: [phone]   Fax:  [phone]  Physical Therapy Treatment  Patient Details  Name: [name] [name] MRN: [id number] Date of Birth: [birth year] Referring Provider (PT): Janie Morning MD   Encounter Date: 12/29/2020   PT End of Session - 12/29/20 1347     Visit Number 2    Number of Visits 8    Date for PT Re-Evaluation 01/25/21    PT Start Time 0100    PT Stop Time 0151    PT Time Calculation (min) 51 min             Past Medical History:  Diagnosis Date   Anxiety    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   Subjective Assessment - 12/29/20 1258     Subjective COVID-19 screen performed prior to patient entering clinic. Patient arrived with ongoing pain.    Pertinent History H/o headaches, Lupus, connective tissue disease, HTN.    How long can you sit comfortably? Varies.    How long can you stand comfortably? Varies.    How long can you walk comfortably? Varies.    Patient Stated Goals Would be nice to enjoy life more such as going on hikes.    Currently in Pain? Yes    Pain Score 6     Pain Location Back    Pain Orientation Right;Left    Pain Descriptors / Indicators Discomfort    Pain Type Chronic pain    Pain Onset More than a month ago    Pain Frequency Constant    Aggravating Factors  prolong activity    Pain Relieving Factors rest heat ES                                [facility] Adult PT Treatment/Exercise - 12/29/20 0001       Self-Care   Self-Care ADL's;Lifting;Posture;Heat/Ice Application;Other Self-Care Comments    Other Self-Care Comments  HEP provided for all above      Moist Heat Therapy   Number Minutes Moist Heat 15 Minutes    Moist Heat Location Lumbar Spine      Electrical Stimulation   Electrical Stimulation Location Bilateral lumbar region.    Electrical Stimulation Action IFC    Electrical Stimulation Parameters 80-150hz  x34mn    Electrical Stimulation Goals Pain;Tone      Manual Therapy   Manual Therapy Soft tissue mobilization    Soft tissue mobilization manual light pressure STW to low back paraspinals and glut to reduce pain and tone                    PT Education - 12/29/20 1342     Education Details HEP    Person(s) Educated Patient    Methods Explanation;Demonstration;Handout    Comprehension Verbalized understanding;Returned demonstration

## 2020-12-29 NOTE — Patient Instructions (Signed)

## 2020-12-29 NOTE — Therapy (Addendum)
[facility] [address] 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   Subjective Assessment - 12/29/20 1258     Subjective COVID-19 screen performed prior to patient entering clinic. Patient arrived with ongoing pain.    Pertinent History H/o headaches, Lupus, connective tissue disease, HTN.    How long can you sit comfortably? Varies.    How long can you stand comfortably? Varies.    How long can you walk comfortably? Varies.    Patient Stated Goals Would be nice to enjoy life more such as going on hikes.    Currently in Pain? Yes    Pain Score 6     Pain Location Back    Pain Orientation Right;Left    Pain Descriptors / Indicators Discomfort    Pain Type Chronic pain    Pain Onset More than a month ago    Pain Frequency Constant    Aggravating Factors  prolong activity    Pain Relieving Factors rest heat ES                                [facility] Adult PT Treatment/Exercise - 12/29/20 0001       Self-Care   Self-Care ADL's;Lifting;Posture;Heat/Ice Application;Other Self-Care Comments    Other Self-Care Comments  HEP provided for all above      Moist Heat Therapy   Number Minutes Moist Heat 15 Minutes    Moist Heat Location Lumbar Spine      Electrical Stimulation   Electrical Stimulation Location Bilateral lumbar region.    Electrical Stimulation Action IFC    Electrical Stimulation Parameters 80-150hz  x15min    Electrical Stimulation Goals Pain;Tone      Manual Therapy   Manual Therapy Soft tissue mobilization    Soft tissue mobilization manual light pressure STW to low back paraspinals and glut to reduce pain and tone                    PT Education - 12/29/20 1342     Education Details HEP    Person(s) Educated Patient    Methods Explanation;Demonstration;Handout    Comprehension Verbalized understanding;Returned demonstration  PT Long Term Goals - 12/28/20 1644       PT LONG TERM GOAL #1   Title Independent with a HEP.    Time 4    Period Weeks    Status New      PT LONG TERM GOAL #2   Title Perform ADL's with pain not > 4/10.    Time 4    Period Weeks    Status New      PT LONG TERM GOAL #3   Title Eliminate LE symptoms.    Time 4    Period Weeks    Status New                   Plan - 12/29/20 1342     Clinical Impression Statement Patient arrived with a lot of pain in right low back and other areas. Patient unable to perform any exercises today. Today focused on manual STW to decrease pain and tone followed by modalities to decrease pain. Educated patient on posture awareness techniques with HEP provided. Patient has tighntess in low back and glut muscles and only minimal pressure with manual tharapy due to pain. Patient current goals ongoing due to pain deficts.    Personal Factors and Comorbidities  Comorbidity 1;Comorbidity 2;Other    Comorbidities H/o headaches, Lupus, connective tissue disease, HTN.    Examination-Activity Limitations Locomotion Level;Other    Examination-Participation Restrictions Other    Stability/Clinical Decision Making Evolving/Moderate complexity    Rehab Potential Fair    PT Frequency 2x / week    PT Duration 4 weeks    PT Treatment/Interventions ADLs/Self Care Home Management;Cryotherapy;Electrical Stimulation;Ultrasound;Moist Heat;Therapeutic activities;Therapeutic exercise;Manual techniques;Patient/family education;Passive range of motion    PT Next Visit Plan cont with POC for STW/M, core exercise progression, modalities as needed.    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  Current functional level related to goals / functional outcomes: See above.   Remaining deficits: See below.   Education / Equipment:     Patient agrees to discharge. Patient goals were not met. Patient is being discharged due to not returning since the last visit.    [place] MPT

## 2021-01-05 ENCOUNTER — Ambulatory Visit: Payer: Managed Care, Other (non HMO)

## 2021-10-06 ENCOUNTER — Other Ambulatory Visit: Payer: Self-pay | Admitting: Rheumatology

## 2021-10-06 DIAGNOSIS — M461 Sacroiliitis, not elsewhere classified: Secondary | ICD-10-CM

## 2021-10-27 ENCOUNTER — Other Ambulatory Visit: Payer: Managed Care, Other (non HMO)

## 2021-11-16 ENCOUNTER — Ambulatory Visit: Payer: Medicaid Other | Attending: Pain Medicine | Admitting: Physical Therapy

## 2021-11-16 ENCOUNTER — Encounter: Payer: Self-pay | Admitting: Physical Therapy

## 2021-11-16 DIAGNOSIS — M5442 Lumbago with sciatica, left side: Secondary | ICD-10-CM | POA: Diagnosis present

## 2021-11-16 DIAGNOSIS — G8929 Other chronic pain: Secondary | ICD-10-CM | POA: Insufficient documentation

## 2021-11-16 DIAGNOSIS — M5441 Lumbago with sciatica, right side: Secondary | ICD-10-CM | POA: Diagnosis present

## 2021-11-16 NOTE — Therapy (Signed)
?[facility] ?[address] ?[place], [place], [address] ?Phone: [phone]   Fax:  [phone] ? ?Physical Therapy Evaluation ? ?Patient Details  ?Name: [name] [name] ?MRN: [id number] ?Date of Birth: January 11, 1991 ?Referring Provider (PT): Horton Chin MD ? ? ?Encounter Date: 11/16/2021 ? ? PT End of Session - 11/16/21 1437   ? ? Visit Number 1   ? Number of Visits 12   ? Date for PT Re-Evaluation 12/28/21   ? PT Start Time 1258   ? PT Stop Time 1325   ? PT Time Calculation (min) 27 min   ? Activity Tolerance Patient tolerated treatment well   ? Behavior During Therapy [facility] for tasks assessed/performed   ? ?  ?  ? ?  ? ? ?Past Medical History:  ?Diagnosis Date  ? Anxiety   ? 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 Celiac disease 08/17/2013  ? 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  ? Depression   ? GERD (gastroesophageal reflux disease)   ? Gestational diabetes 12/15/2013  ? Headache   ? Hx of migraines   ? Hypertension   ? Hypothyroidism   ? IBS (irritable bowel syndrome)   ? Lupus (HCC)   ? Vitamin D deficiency   ? Warm antibody hemolytic anemia (HCC)   ? ? ?Past Surgical History:  ?Procedure Laterality Date  ? NO PAST SURGERIES    ? ? ?There were no vitals filed for this visit. ? ? ? Subjective Assessment - 11/16/21 1429   ? ? Subjective The patient presents to the clinic today with c/o chronic low back pain that has been ongoing and worsening over the last three years are so.  She rates her pain at a 6/10 today at rest but can rise to 10/10 with standing and sitting too long.  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  Medication, heat and massage can help decrease her pain some.   ? Pertinent History Fibromyalgia, ANA positive (Lupus), Ankylosing Spondylitis, HTN, hypothyroidism, h/o migraines.   ? How long can you sit comfortably? Varies, up to an hour but very pain   ? How long can you  stand comfortably? Varies but the longer she stands the worse her pain gets.   ? How long can you walk comfortably? Increased walking produces severe pain.   ? Patient Stated Goals Would like to be able to keep working if possible as she really enjoys what she does.   ? Currently in Pain? Yes   ? Pain Score 6    ? Pain Location Back   ? Pain Orientation Right;Left   ? Pain Descriptors / Indicators Aching;Sharp   ? Pain Type Chronic pain   ? Pain Radiating Towards Into bilateral groin region.   ? Pain Onset More than a month ago   ? Pain Frequency Constant   ? Aggravating Factors  See above.   ? Pain Relieving Factors See above.   ? ?  ?  ? ?  ? ? ? ? ? [facility] PT Assessment - 11/16/21 0001   ? ?  ? Assessment  ? Medical Diagnosis Sacroiliac joint pain.   ? Referring Provider (PT) Horton Chin MD   ? Onset Date/Surgical Date --   ~3 years.  ?  ? Precautions  ? Precautions None   ?  ? Restrictions  ? Weight Bearing Restrictions No   ?  ? Balance Screen  ?  Has the patient fallen in the past 6 months Yes   ? How many times? 1.   ? Has the patient had a decrease in activity level because of a fear of falling?  No   ? Is the patient reluctant to leave their home because of a fear of falling?  No   ?  ? Home Environment  ? Living Environment Private residence   ?  ? Prior Function  ? Level of Independence Independent   ?  ? Posture/Postural Control  ? Posture/Postural Control No significant limitations   ?  ? Deep Tendon Reflexes  ? DTR Assessment Site Patella;Achilles   ? Patella DTR 1+   ? Achilles DTR 1+   ?  ? ROM / Strength  ? AROM / PROM / Strength AROM;Strength   ?  ? AROM  ? Overall AROM Comments Lumbar flexion limited by 50% and painful.  Lumbar extension is full and not increasing pain.   ?  ? Strength  ? Overall Strength Comments Normal LE strength.   ?  ? Palpation  ? Palpation comment Tender c/o diffuse bilateral lumbar musculature pain and she was palpably tender over both her SIJ's.   ?  ? Special Tests  ?  Other special tests Equal leg lengths. (-) SLR. (+) FABER testing bilaterally.   ?  ? Ambulation/Gait  ? Gait Comments WNL.   ? ?  ?  ? ?  ? ? ? ? ? ? ? ? ? ? ? ? ? ?Objective measurements completed on examination: See above findings.  ? ? ? ? ? ? ? ? ? ? ? ? ? ? ? ? ? ? ? PT Long Term Goals - 11/16/21 1509   ? ?  ? PT LONG TERM GOAL #1  ? Title Independent with a HEP.   ? Baseline No knowledge of appropriate ther ex.   ? Time 6   ? Period Weeks   ? Status New   ?  ? PT LONG TERM GOAL #2  ? Title Perform ADL's with pain not > 4/10.   ? Baseline Pain can rise to a 10/10.   ? Time 4   ? Period Weeks   ? Status New   ?  ? PT LONG TERM GOAL #3  ? Title End of workday pain-level not > 4/10.   ? Baseline Pain can rise to a 10/10.   ? Time 4   ? Period Weeks   ? Status New   ? ?  ?  ? ?  ? ? ? ? ? ? ? ? ? Plan - 11/16/21 1502   ? ? Clinical Impression Statement The patient presents to [facility] with chronic low back pain that has been ongoing for about three years.  She states she was recently diagnosed with Anklyosing Spondylitis.  She was found to have diffuse lumbar musculature pain as well as palpable pain over both SIJ's.  Her functional mobility is impaired due to high pain-levels which prolonged sitting, standing and walking.  She works as a Engineer, civil (consulting) and the demands of the job often produce severe pain.  She demonstrates bilateral FABER testing.  Patient may benefit from skilled physical therapy intervention to address pain and deficits.   ? Personal Factors and Comorbidities Comorbidity 1;Comorbidity 2;Other   ? Comorbidities Fibromyalgia, ANA positive (Lupus), Ankylosing Spondylitis, HTN, hypothyroidism, h/o migraines.   ? Examination-Activity Limitations Locomotion Level;Sit;Stand;Other   ? Examination-Participation Restrictions Other   ? Stability/Clinical Decision Making  Evolving/Moderate complexity   ? Clinical Decision Making Low   ? Rehab Potential Fair   ? PT Frequency 2x / week   ? PT Duration 6 weeks   ? PT  Treatment/Interventions ADLs/Self Care Home Management;Cryotherapy;Electrical Stimulation;Ultrasound;Moist Heat;Functional mobility training;Therapeutic activities;Therapeutic exercise;Manual techniques;Patient/family education;Passive range of motion   ? PT Next Visit Plan Modalites and STW/M, core exercise progression.   ? Consulted and Agree with Plan of Care Patient   ? ?  ?  ? ?  ? ? ?Patient will benefit from skilled therapeutic intervention in order to improve the following deficits and impairments:  Decreased activity tolerance, Decreased range of motion, Pain, Increased muscle spasms ? ?Visit Diagnosis: ?Chronic bilateral low back pain with bilateral sciatica ? ? ? ? ?Problem List ?Patient Active Problem List  ? Diagnosis Date Noted  ? GAD (generalized anxiety disorder) 08/13/2017  ? Asthma, chronic 02/12/2015  ? 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 Lupus (systemic lupus erythematosus) (HCC) 10/14/2013  ? Weight gain 08/15/2013  ? Depression, recurrent (HCC)   ? Vitamin D deficiency   ? Unspecified vitamin D deficiency 07/14/2013  ? Hypothyroid 07/14/2013  ? ? ?Mikhi Athey, [place], PT ?11/16/2021, 3:14 PM ? ?[place] ?[facility] ?[address] ?[place], [place], [address] ?Phone: [phone]   Fax:  [phone] ? ?Name: Necola Troll ?MRN: [id number] ?Date of Birth: February 13, 1991 ? ? ?

## 2021-11-23 ENCOUNTER — Ambulatory Visit
Admission: RE | Admit: 2021-11-23 | Discharge: 2021-11-23 | Disposition: A | Discharge status: Home / Self Care | Payer: Medicaid Other | Source: Ambulatory Visit | Attending: Rheumatology | Admitting: Rheumatology

## 2021-11-23 DIAGNOSIS — M461 Sacroiliitis, not elsewhere classified: Secondary | ICD-10-CM

## 2021-11-27 ENCOUNTER — Other Ambulatory Visit: Payer: Medicaid Other

## 2021-12-06 ENCOUNTER — Ambulatory Visit: Payer: Medicaid Other | Admitting: *Deleted

## 2021-12-06 DIAGNOSIS — M5442 Lumbago with sciatica, left side: Secondary | ICD-10-CM | POA: Diagnosis not present

## 2021-12-06 DIAGNOSIS — G8929 Other chronic pain: Secondary | ICD-10-CM

## 2021-12-06 NOTE — Therapy (Signed)
[facility] [address] [place], [place], [address] Phone: [phone]   Fax:  [phone]  Physical Therapy Treatment  Patient Details  Name: Benecia Aston MRN: [id number] Date of Birth: 01/11/1991 Referring Provider (PT): Horton Chin MD   Encounter Date: 12/06/2021   PT End of Session - 12/06/21 1124     Visit Number 2    Number of Visits 12    Date for PT Re-Evaluation 12/28/21    PT Start Time 1115    PT Stop Time 1205    PT Time Calculation (min) 50 min             Past Medical History:  Diagnosis Date   Anxiety    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   Subjective Assessment - 12/06/21 1118     Subjective Want to try HEP.MRI last week L5-S1 problems    Pertinent History Fibromyalgia, ANA positive (Lupus), Ankylosing Spondylitis, HTN, hypothyroidism, h/o migraines.    How long can you sit comfortably? Varies, up to an hour but very pain    How long can you stand comfortably? Varies but the longer she stands the worse her pain gets.    How long can you walk comfortably? Increased walking produces severe pain.    Patient Stated Goals Would like to be able to keep working if possible as she really enjoys what she does.    Currently in Pain? Yes    Pain Score 7     Pain Location Back    Pain Orientation Left;Right    Pain Descriptors / Indicators Aching;Sharp    Pain Type Chronic pain    Pain Onset More than a month ago                                [facility] Adult PT Treatment/Exercise - 12/06/21 0001       Therapeutic Activites    Therapeutic Activities ADL's;Lifting    ADL's Discussed AB bracing with Transitional mvmnts with ADL's and how to avoid pain triggers      Exercises   Exercises Lumbar      Lumbar Exercises: Standing   Other Standing Lumbar Exercises Discussed modified bird-dog at counter with arm raise to challenge core      Lumbar Exercises: Supine   Ab Set 10 reps;5 seconds    Bent Knee Raise 10 reps   decreased pain with AB bracing, but still with some pain increase   Dead Bug --   modified dead bug with only arm raise due to pain with leg raise.     Lumbar Exercises: Quadruped   [place]/Old Horse 10 reps   modified ROM due to increased pain with anterior pelvic tilt   [place]/Old Horse Limitations extension ROM    Single Arm Raise --   discussed to try next visit  PT Long Term Goals - 11/16/21 1509       PT LONG TERM GOAL #1   Title Independent with a HEP.    Baseline No knowledge of appropriate ther ex.    Time 6    Period Weeks    Status New      PT LONG TERM GOAL #2   Title Perform ADL's with pain not > 4/10.    Baseline Pain can rise to a 10/10.    Time 4    Period Weeks    Status New      PT LONG TERM GOAL #3   Title End of workday pain-level not > 4/10.    Baseline Pain can rise to a 10/10.    Time 4    Period Weeks    Status New                   Plan - 12/06/21 1140     Clinical Impression Statement Pt arrived with high pain levels and reports having injections tomorrow. Rx focused on core stabilization with AB bracing to help control pain triggers with transitional movements as well as with ADL's. gentle spinal mobility was addressed also with cat/camel exercise. She did fair with pelvic control, but was unable to relax in extension position due to increased pain. Focus on finding core activation exs  that do not increase her pain.    Personal Factors and Comorbidities Comorbidity 1;Comorbidity 2;Other    Comorbidities Fibromyalgia, ANA positive (Lupus), Ankylosing Spondylitis, HTN, hypothyroidism, h/o migraines.    Examination-Participation Restrictions Other    Stability/Clinical Decision Making Evolving/Moderate complexity    Rehab Potential Fair    PT Frequency 2x / week    PT Treatment/Interventions ADLs/Self Care Home Management;Cryotherapy;Electrical Stimulation;Ultrasound;Moist Heat;Functional mobility training;Therapeutic activities;Therapeutic exercise;Manual techniques;Patient/family education;Passive range of motion    PT Next Visit Plan Modalites and STW/M, core exercise progression.    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 [place], [place], [address] Phone: [phone]   Fax:  [phone]  Name: Elienai Thane MRN: [id number] Date of Birth: 03-Oct-1990

## 2021-12-13 ENCOUNTER — Ambulatory Visit: Payer: Medicaid Other | Admitting: *Deleted

## 2021-12-13 DIAGNOSIS — M5442 Lumbago with sciatica, left side: Secondary | ICD-10-CM | POA: Diagnosis not present

## 2021-12-13 NOTE — Therapy (Addendum)
[facility] [place], [place], [address] Phone: [phone]   Fax:  [phone]  Physical Therapy Treatment  Patient Details  Name: [name] [name] MRN: [id number] Date of Birth: 01-20-1991 Referring Provider (PT): Buford Dresser MD   Encounter Date: 12/13/2021   PT End of Session - 12/13/21 1002     Visit Number 3    Number of Visits 12    Date for PT Re-Evaluation 12/28/21    Authorization - Visit Number 2    Authorization - Number of Visits 3    PT Start Time 0900    PT Stop Time 3536    PT Time Calculation (min) 47 min             Past Medical History:  Diagnosis Date   Anxiety    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   Subjective Assessment - 12/13/21 0902     Subjective My injections really didn't help my pain. 4/10    Pertinent History Fibromyalgia, ANA positive (Lupus), Ankylosing Spondylitis, HTN, hypothyroidism, h/o migraines.    How long can you sit comfortably? Varies, up to an hour but very pain    How long can you stand comfortably? Varies but the longer she stands the worse her pain gets.    How long can you walk comfortably? Increased walking produces severe pain.    Patient Stated Goals Would like to be able to keep working if possible as she really enjoys what she does.    Currently in Pain? Yes    Pain Score 4     Pain Location Back    Pain Orientation Right;Left    Pain Descriptors / Indicators Aching;Sharp    Pain Type  Chronic pain    Pain Onset More than a month ago                               [facility] Adult PT Treatment/Exercise - 12/13/21 0001       Therapeutic Activites    ADL's Discussed AB bracing with Transitional mvmnts with ADL's and how to avoid pain triggers      Exercises   Exercises Lumbar      Lumbar Exercises: Aerobic   Tread Mill 1.5 MPH x 5 mins    Nustep L4 x 5 mins UE and LE activity      Lumbar Exercises: Standing   Scapular Retraction Strengthening;Both;10 reps;Theraband   HABD red band   Theraband Level (Scapular Retraction) Level 2 (Red)    Row Strengthening;Theraband;Both   red   Shoulder Extension Strengthening;Both;Theraband;10 reps   red band   Other Standing Lumbar Exercises modified bird-dog at counter with arm raise to challenge core x6  hold 5      Modalities   Modalities Ultrasound      Ultrasound   Ultrasound Location Bil SIJs  LT sidelying  Ultrasound Parameters 1.5 w/cm2 x 10 mins 5 each side    Ultrasound Goals Pain                          PT Long Term Goals - 11/16/21 1509       PT LONG TERM GOAL #1   Title Independent with a HEP.    Baseline No knowledge of appropriate ther ex.    Time 6    Period Weeks    Status New      PT LONG TERM GOAL #2   Title Perform ADL's with pain not > 4/10.    Baseline Pain can rise to a 10/10.    Time 4    Period Weeks    Status New      PT LONG TERM GOAL #3   Title End of workday pain-level not > 4/10.    Baseline Pain can rise to a 10/10.    Time 4    Period Weeks    Status New                   Plan - 12/13/21 1003     Clinical Impression Statement Pt arrived today with 4/10 pain and was able to perform nustep and TM for 5 mins each and tolerated fair. Rx also focused on more core activation exs without increasing pain. Standing modified bird dog arm lifts were performed in standing, as well as tband exs. Handouts given for HEP. [place] combo performed end  of session for pain control and tolerated well    Personal Factors and Comorbidities Comorbidity 1;Comorbidity 2;Other    Comorbidities Fibromyalgia, ANA positive (Lupus), Ankylosing Spondylitis, HTN, hypothyroidism, h/o migraines.    Examination-Participation Restrictions Other    Stability/Clinical Decision Making Evolving/Moderate complexity    Rehab Potential Fair    PT Frequency 2x / week    PT Duration 6 weeks    PT Treatment/Interventions ADLs/Self Care Home Management;Cryotherapy;Electrical Stimulation;Ultrasound;Moist Heat;Functional mobility training;Therapeutic activities;Therapeutic exercise;Manual techniques;Patient/family education;Passive range of motion    PT Next Visit Plan Modalites and STW/M, core exercise progression.    Consulted and Agree with Plan of Care Patient             Patient will benefit from skilled therapeutic intervention in order to improve the following deficits and impairments:  Decreased activity tolerance, Decreased range of motion, Pain, Increased muscle spasms  Visit Diagnosis: Chronic bilateral low back pain with bilateral sciatica     Problem List Patient Active Problem List   Diagnosis Date Noted   GAD (generalized anxiety disorder) 08/13/2017   Asthma, chronic 02/12/2015   Warm antibody hemolytic anemia ([place]) 07/24/2014   Lupus (systemic lupus erythematosus) ([place]) 10/14/2013   Weight gain 08/15/2013   Depression, recurrent ([place])    Vitamin D deficiency    Unspecified vitamin D deficiency 07/14/2013   Hypothyroid 07/14/2013    Erminia Mcnew,CHRIS, PTA 12/13/2021, 11:38 AM  [facility] [address] [place], [place], [address] Phone: [phone]   Fax:  [phone]  Name: [name] [name] MRN: [id number] Date of Birth: 1991/04/05   PHYSICAL THERAPY DISCHARGE SUMMARY  Visits from Start of Care: 3.  Current functional level related to goals / functional outcomes: See above.    Remaining deficits: See below.   Education / Equipment:   Patient agrees to discharge. Patient goals were not met. Patient is being discharged due to not returning since the last visit.  Ultrasound Parameters 1.5 w/cm2 x 10 mins 5 each side    Ultrasound Goals Pain                          PT Long Term Goals - 11/16/21 1509       PT LONG TERM GOAL #1   Title Independent with a HEP.    Baseline No knowledge of appropriate ther ex.    Time 6    Period Weeks    Status New      PT LONG TERM GOAL #2   Title Perform ADL's with pain not > 4/10.    Baseline Pain can rise to a 10/10.    Time 4    Period Weeks    Status New      PT LONG TERM GOAL #3   Title End of workday pain-level not > 4/10.    Baseline Pain can rise to a 10/10.    Time 4    Period Weeks    Status New                   Plan - 12/13/21 1003     Clinical Impression Statement Pt arrived today with 4/10 pain and was able to perform nustep and TM for 5 mins each and tolerated fair. Rx also focused on more core activation exs without increasing pain. Standing modified bird dog arm lifts were performed in standing, as well as tband exs. Handouts given for HEP. [place] combo performed end  of session for pain control and tolerated well    Personal Factors and Comorbidities Comorbidity 1;Comorbidity 2;Other    Comorbidities Fibromyalgia, ANA positive (Lupus), Ankylosing Spondylitis, HTN, hypothyroidism, h/o migraines.    Examination-Participation Restrictions Other    Stability/Clinical Decision Making Evolving/Moderate complexity    Rehab Potential Fair    PT Frequency 2x / week    PT Duration 6 weeks    PT Treatment/Interventions ADLs/Self Care Home Management;Cryotherapy;Electrical Stimulation;Ultrasound;Moist Heat;Functional mobility training;Therapeutic activities;Therapeutic exercise;Manual techniques;Patient/family education;Passive range of motion    PT Next Visit Plan Modalites and STW/M, core exercise progression.    Consulted and Agree with Plan of Care Patient             Patient will benefit from skilled therapeutic intervention in order to improve the following deficits and impairments:  Decreased activity tolerance, Decreased range of motion, Pain, Increased muscle spasms  Visit Diagnosis: Chronic bilateral low back pain with bilateral sciatica     Problem List Patient Active Problem List   Diagnosis Date Noted   GAD (generalized anxiety disorder) 08/13/2017   Asthma, chronic 02/12/2015   Warm antibody hemolytic anemia ([place]) 07/24/2014   Lupus (systemic lupus erythematosus) ([place]) 10/14/2013   Weight gain 08/15/2013   Depression, recurrent ([place])    Vitamin D deficiency    Unspecified vitamin D deficiency 07/14/2013   Hypothyroid 07/14/2013    Erminia Mcnew,CHRIS, PTA 12/13/2021, 11:38 AM  [facility] [address] [place], [place], [address] Phone: [phone]   Fax:  [phone]  Name: [name] [name] MRN: [id number] Date of Birth: 1991/04/05   PHYSICAL THERAPY DISCHARGE SUMMARY  Visits from Start of Care: 3.  Current functional level related to goals / functional outcomes: See above.    Remaining deficits: See below.   Education / Equipment:   Patient agrees to discharge. Patient goals were not met. Patient is being discharged due to not returning since the last visit.

## 2021-12-26 ENCOUNTER — Encounter: Payer: Medicaid Other | Admitting: *Deleted

## 2024-02-06 ENCOUNTER — Ambulatory Visit: Attending: Internal Medicine

## 2024-02-06 DIAGNOSIS — M5459 Other low back pain: Secondary | ICD-10-CM | POA: Diagnosis present

## 2024-02-06 DIAGNOSIS — M542 Cervicalgia: Secondary | ICD-10-CM | POA: Diagnosis present

## 2024-02-06 NOTE — Therapy (Signed)
 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   Celiac disease 08/17/2013   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   Depression    GERD (gastroesophageal reflux disease)    Gestational diabetes 12/15/2013   Headache    Hx of migraines    Hypertension    Hypothyroidism    IBS (irritable bowel syndrome)    Lupus    Vitamin D deficiency    Warm antibody hemolytic anemia (HCC)    Past Surgical History:  Procedure Laterality Date   NO PAST SURGERIES     Patient Active Problem List   Diagnosis Date Noted   GAD (generalized anxiety disorder) 08/13/2017   Asthma, chronic 02/12/2015   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   Lupus (systemic lupus erythematosus) (HCC) 10/14/2013   Weight gain 08/15/2013   Depression, recurrent (HCC)    Vitamin D deficiency    Vitamin D deficiency 07/14/2013   Hypothyroid 07/14/2013   REFERRING PROVIDER: Ingrid Nurse, MD   REFERRING DIAG: Cervicalgia, Pain in right hip, Pain in left hip   THERAPY DIAG:  Cervicalgia  Other low back pain  Rationale for Evaluation and Treatment: Rehabilitation  ONSET DATE: 2006  SUBJECTIVE:                                                                                                                                                                                                         SUBJECTIVE STATEMENT: Patient reports that she has been having pain and problems  since she was about 33 years old after climbing a mountain. She does not know how this caused her symptoms. She had tried physical therapy before and it helped some while she was here, but it did not last. She feels that her right side is worse than her left side.  Hand dominance: Right  PERTINENT HISTORY:  Asthma, depression, Lupus, anxiety, hypertension, ankylosing spondylitis, ADD, fibromyalgia, PTSD (  per patient report) and allergies  PAIN:  Are you having pain? Yes: NPRS scale: Current: 5/10 (low back) 6/10 (neck) Best: 0/10 (low back) 1/10 (neck) Worst: 10/10  Pain location: neck and lower spine Pain description: gnawing, aching, popping, and grinding  Aggravating factors: lifting, carrying, bending over (low back), looking down  Relieving factors: massage, TENS unit   PRECAUTIONS: Fall  RED FLAGS: None    WEIGHT BEARING RESTRICTIONS: No  FALLS:  Has patient fallen in last 6 months? Yes. Number of falls 2; my hip gives out and locks up due to pain   LIVING ENVIRONMENT: Lives with: lives with their family Lives in: House/apartment Stairs: Yes: External: 8-10 steps; can reach both; step to pattern Has following equipment at home: None  OCCUPATION: not working; she has filed for disability  PLOF: Independent with homemaking with ambulation  PATIENT GOALS: improved strength, reduced pain, be able to bowl  NEXT MD VISIT: 04/28/24  OBJECTIVE:  Note: Objective measures were completed at Evaluation unless otherwise noted.  PATIENT SURVEYS:  NDI:  NECK DISABILITY INDEX  Date: 02/06/24 Score  Pain intensity 1 = The pain is very mild at the moment  2. Personal care (washing, dressing, etc.) 2 = It is painful to look after myself and I am slow and careful  3. Lifting 2 = Pain prevents me lifting heavy weights off the floor, but I can manage if they are  conveniently placed, for example on a table  4. Reading 3 = I can't read as much as I want because of moderate pain in  my neck  5. Headaches 4 = I have severe headaches, which come frequently   6. Concentration 3 = I have a lot of difficulty in concentrating when I want to  7. Work 5 =  I can't do any work at all  8. Driving 5 = I can't drive my car at all  9. Sleeping 5 =  My sleep is completely disturbed (5-7 hrs sleepless)   10. Recreation 4 =  I can hardly do any recreation activities because of pain in my neck  Total 34/50   Minimum Detectable Change (90% confidence): 5 points or 10% points  COGNITION: Overall cognitive status: Within functional limits for tasks assessed  SENSATION: Patient reports intermittent tingling in her neck and low back  POSTURE: rounded shoulders and forward head  PALPATION: TTP (R>L): bilateral upper trapezius, levator scapulae, cervical paraspinals, scapular stabilizers, and right latissimus dorsi    CERVICAL ROM:   Active ROM A/PROM (deg) eval  Flexion 22; increased pain  Extension 8  Right lateral flexion 10; increased pain  Left lateral flexion 28  Right rotation 41; most pain  Left rotation 38   (Blank rows = not tested)  LUMBAR ROM:   Active  A/PROM  eval  Flexion 18; painful   Extension 14  Right lateral flexion 50% limited; painful  Left lateral flexion 25% limited  Right rotation 50% limited; painful   Left rotation 50% limited   (Blank rows = not tested)   UPPER EXTREMITY ROM:  Active ROM Right eval Left eval  Shoulder flexion 123 107  Shoulder extension    Shoulder abduction 80; really aggravating 111  Shoulder adduction    Shoulder extension    Shoulder internal rotation To gluteals To PSIS  Shoulder external rotation To C7 To T1  Elbow flexion    Elbow extension    Wrist flexion    Wrist extension    Wrist  ulnar deviation    Wrist radial deviation    Wrist pronation    Wrist supination     (Blank rows = not tested)  UPPER EXTREMITY MMT: unable to assess due to pain severity and irritability  TREATMENT DATE:                                                                                                                                   PATIENT EDUCATION:  Education details: POC, prognosis, objective findings, and goals for physical therapy Person educated: Patient Education method: Explanation Education comprehension: verbalized understanding  HOME EXERCISE PROGRAM:   ASSESSMENT:  CLINICAL IMPRESSION: Patient is a 33 y.o. female who was seen today for physical therapy evaluation and treatment for chronic cervical and lumbar pain.  She presented with high pain severity and irritability with multiple assessments reproducing her familiar symptoms.  She exhibited significant deficits in cervical, lumbar, and bilateral upper extremity active range of motion with pain being her primary limiting factor.  Manual muscle testing was unable to be performed due to her high pain severity and irritability.  Recommend that she continue with skilled physical therapy to address her impairments to maximize her functional mobility.  OBJECTIVE IMPAIRMENTS: decreased activity tolerance, decreased balance, decreased mobility, difficulty walking, decreased ROM, decreased strength, hypomobility, impaired flexibility, impaired tone, impaired UE functional use, postural dysfunction, and pain.   ACTIVITY LIMITATIONS: carrying, lifting, bending, standing, stairs, reach over head, and locomotion level  PARTICIPATION LIMITATIONS: meal prep, cleaning, laundry, and community activity  PERSONAL FACTORS: Past/current experiences, Time since onset of injury/illness/exacerbation, and 3+ comorbidities: Asthma, depression, Lupus, anxiety, hypertension, ankylosing spondylitis, ADD, fibromyalgia, PTSD (per patient report) and allergies are also affecting patient's functional outcome.   REHAB POTENTIAL: Fair    CLINICAL DECISION MAKING: Evolving/moderate complexity  EVALUATION COMPLEXITY: Moderate   GOALS: Goals reviewed with patient?  Yes  SHORT TERM GOALS: Target date: 02/27/24  Patient will be independent with her initial HEP.  Baseline:  Goal status: INITIAL  2.  Patient will be able to complete her daily activities without her familiar pain exceeding 8/10.  Baseline:  Goal status: INITIAL  3.  Patient will improve her NDI score to 50% disability or less for improved perceived function with her daily activities. Baseline:  Goal status: INITIAL  4.  Patient will improve her cervical rotation to at least 60 degrees bilaterally for improved safety while driving. Baseline:  Goal status: INITIAL  LONG TERM GOALS: Target date: 03/19/24  Patient will be independent with her advanced HEP. Baseline:  Goal status: INITIAL  2.  Patient will be able to complete her daily activities without her familiar symptoms exceeding 6/10. Baseline:  Goal status: INITIAL  3.  Patient will improve her NDI score to 40% disability or less for improved perceived function with her daily activities. Baseline:  Goal status: INITIAL  4.  Patient will improve her active right shoulder abduction to at least  115 degrees for improved function reaching overhead. Baseline:  Goal status: INITIAL  5.  Patient will report being able to go bowling without being limited by her familiar symptoms. Baseline:  Goal status: INITIAL  PLAN:  PT FREQUENCY: 2x/week  PT DURATION: 6 weeks  PLANNED INTERVENTIONS: 97164- PT Re-evaluation, 97750- Physical Performance Testing, 97110-Therapeutic exercises, 97530- Therapeutic activity, [id number]- Neuromuscular re-education, 97535- Self Care, [id number]- Manual therapy, [phone]- Gait training, [phone]- Electrical stimulation (unattended), 97035- Ultrasound, 79439 (1-2 muscles), [id number] (3+ muscles)- Dry Needling, Patient/Family education, Balance training, Stair training, Joint mobilization, Spinal mobilization, Cryotherapy, and Moist heat  PLAN FOR NEXT SESSION: Nustep, isometrics, lower trunk rotations, chin tucks,  scapular retraction, manual therapy, and modalities as needed   Lacinda JAYSON Fass, PT 02/06/2024, 4:37 PM

## 2024-02-11 ENCOUNTER — Ambulatory Visit

## 2024-02-11 DIAGNOSIS — M542 Cervicalgia: Secondary | ICD-10-CM | POA: Diagnosis not present

## 2024-02-11 DIAGNOSIS — M5459 Other low back pain: Secondary | ICD-10-CM

## 2024-02-11 NOTE — Therapy (Signed)
 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   Celiac disease 08/17/2013   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   Depression    GERD (gastroesophageal reflux disease)    Gestational diabetes 12/15/2013   Headache    Hx of migraines    Hypertension    Hypothyroidism    IBS (irritable bowel syndrome)    Lupus    Vitamin D deficiency    Warm antibody hemolytic anemia (HCC)    Past Surgical History:  Procedure Laterality Date   NO PAST SURGERIES     Patient Active Problem List   Diagnosis Date Noted   GAD (generalized anxiety disorder) 08/13/2017   Asthma, chronic 02/12/2015   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   Lupus (systemic lupus erythematosus) (HCC) 10/14/2013   Weight gain 08/15/2013   Depression, recurrent (HCC)    Vitamin D deficiency    Vitamin D deficiency 07/14/2013   Hypothyroid 07/14/2013   REFERRING PROVIDER: Ingrid Nurse, MD   REFERRING DIAG: Cervicalgia, Pain in right hip, Pain in left hip   THERAPY DIAG:  Cervicalgia  Other low back pain  Rationale for Evaluation and Treatment: Rehabilitation  ONSET DATE: 2006  SUBJECTIVE:                                                                                                                                                                                                         SUBJECTIVE STATEMENT: Patient reports that she feels a lot better  since she started taking her Humara again. She still has a little pain in her neck, but none in her back. She was even able to help her daughter move this weekend.  Hand dominance: Right  PERTINENT HISTORY:  Asthma, depression, Lupus, anxiety, hypertension, ankylosing spondylitis, ADD, fibromyalgia, PTSD (per patient report) and allergies  PAIN:  Are you having pain? Yes: NPRS scale: Current: 0/10 (low back) 2-3/10 (  neck) Best: 0/10 (low back) 1/10 (neck) Worst: 10/10  Pain location: neck and lower spine Pain description: gnawing, aching, popping, and grinding  Aggravating factors: lifting, carrying, bending over (low back), looking down  Relieving factors: massage, TENS unit   PRECAUTIONS: Fall  RED FLAGS: None    WEIGHT BEARING RESTRICTIONS: No  FALLS:  Has patient fallen in last 6 months? Yes. Number of falls 2; my hip gives out and locks up due to pain   LIVING ENVIRONMENT: Lives with: lives with their family Lives in: House/apartment Stairs: Yes: External: 8-10 steps; can reach both; step to pattern Has following equipment at home: None  OCCUPATION: not working; she has filed for disability  PLOF: Independent with homemaking with ambulation  PATIENT GOALS: improved strength, reduced pain, be able to bowl  NEXT MD VISIT: 04/28/24  OBJECTIVE:  Note: Objective measures were completed at Evaluation unless otherwise noted.  PATIENT SURVEYS:  NDI:  NECK DISABILITY INDEX  Date: 02/06/24 Score  Pain intensity 1 = The pain is very mild at the moment  2. Personal care (washing, dressing, etc.) 2 = It is painful to look after myself and I am slow and careful  3. Lifting 2 = Pain prevents me lifting heavy weights off the floor, but I can manage if they are  conveniently placed, for example on a table  4. Reading 3 = I can't read as much as I want because of moderate pain in my neck  5. Headaches 4 = I have severe headaches, which come frequently   6. Concentration 3 = I  have a lot of difficulty in concentrating when I want to  7. Work 5 =  I can't do any work at all  8. Driving 5 = I can't drive my car at all  9. Sleeping 5 =  My sleep is completely disturbed (5-7 hrs sleepless)   10. Recreation 4 =  I can hardly do any recreation activities because of pain in my neck  Total 34/50   Minimum Detectable Change (90% confidence): 5 points or 10% points  COGNITION: Overall cognitive status: Within functional limits for tasks assessed  SENSATION: Patient reports intermittent tingling in her neck and low back  POSTURE: rounded shoulders and forward head  PALPATION: TTP (R>L): bilateral upper trapezius, levator scapulae, cervical paraspinals, scapular stabilizers, and right latissimus dorsi    CERVICAL ROM:   Active ROM A/PROM (deg) eval  Flexion 22; increased pain  Extension 8  Right lateral flexion 10; increased pain  Left lateral flexion 28  Right rotation 41; most pain  Left rotation 38   (Blank rows = not tested)  LUMBAR ROM:   Active  A/PROM  eval  Flexion 18; painful   Extension 14  Right lateral flexion 50% limited; painful  Left lateral flexion 25% limited  Right rotation 50% limited; painful   Left rotation 50% limited   (Blank rows = not tested)   UPPER EXTREMITY ROM:  Active ROM Right eval Left eval  Shoulder flexion 123 107  Shoulder extension    Shoulder abduction 80; really aggravating 111  Shoulder adduction    Shoulder extension    Shoulder internal rotation To gluteals To PSIS  Shoulder external rotation To C7 To T1  Elbow flexion    Elbow extension    Wrist flexion    Wrist extension    Wrist ulnar deviation    Wrist radial deviation    Wrist pronation    Wrist supination     (  Blank rows = not tested)  UPPER EXTREMITY MMT: unable to assess due to pain severity and irritability  TREATMENT DATE:                                                                                                                                                                  02/11/24 EXERCISE LOG  Exercise Repetitions and Resistance Comments  Nustep  L4 x 13 minutes   Manual therapy  See below    Scapular retraction  Limited due to pain   Scapular elevation/depression 20 reps  Added to HEP  Seated chin tuck   30 reps  Added to HEP        Blank cell = exercise not performed today  Manual Therapy Soft Tissue Mobilization: right upper trapezius, levator scapulae, cervical paraspinals, and suboccipitals, for reduced pain and tone   PATIENT EDUCATION:  Education details: healing Person educated: Patient Education method: Explanation Education comprehension: verbalized understanding  HOME EXERCISE PROGRAM:   ASSESSMENT:  CLINICAL IMPRESSION: Patient presented to treatment with reduced pain severity and irritability since her last appointment. She was introduced to multiple new interventions for improved cervical and scapular mobility. She experienced increased pain with scapular retraction, but this was able to be reduced with soft tissue mobilization to her cervical paraspinals and upper trapezius. She required verbal and tactile cueing with chin tucks to promote proper biomechanics and limit cervical extension. She reported feeling better upon the conclusion of treatment. She continues to require skilled physical therapy to address her remaining impairments to return to her prior level of function.   OBJECTIVE IMPAIRMENTS: decreased activity tolerance, decreased balance, decreased mobility, difficulty walking, decreased ROM, decreased strength, hypomobility, impaired flexibility, impaired tone, impaired UE functional use, postural dysfunction, and pain.   ACTIVITY LIMITATIONS: carrying, lifting, bending, standing, stairs, reach over head, and locomotion level  PARTICIPATION LIMITATIONS: meal prep, cleaning, laundry, and community activity  PERSONAL FACTORS: Past/current experiences, Time since onset of  injury/illness/exacerbation, and 3+ comorbidities: Asthma, depression, Lupus, anxiety, hypertension, ankylosing spondylitis, ADD, fibromyalgia, PTSD (per patient report) and allergies are also affecting patient's functional outcome.   REHAB POTENTIAL: Fair    CLINICAL DECISION MAKING: Evolving/moderate complexity  EVALUATION COMPLEXITY: Moderate   GOALS: Goals reviewed with patient? Yes  SHORT TERM GOALS: Target date: 02/27/24  Patient will be independent with her initial HEP.  Baseline:  Goal status: INITIAL  2.  Patient will be able to complete her daily activities without her familiar pain exceeding 8/10.  Baseline:  Goal status: INITIAL  3.  Patient will improve her NDI score to 50% disability or less for improved perceived function with her daily activities. Baseline:  Goal status: INITIAL  4.  Patient will improve her cervical rotation to at least 60 degrees bilaterally  for improved safety while driving. Baseline:  Goal status: INITIAL  LONG TERM GOALS: Target date: 03/19/24  Patient will be independent with her advanced HEP. Baseline:  Goal status: INITIAL  2.  Patient will be able to complete her daily activities without her familiar symptoms exceeding 6/10. Baseline:  Goal status: INITIAL  3.  Patient will improve her NDI score to 40% disability or less for improved perceived function with her daily activities. Baseline:  Goal status: INITIAL  4.  Patient will improve her active right shoulder abduction to at least 115 degrees for improved function reaching overhead. Baseline:  Goal status: INITIAL  5.  Patient will report being able to go bowling without being limited by her familiar symptoms. Baseline:  Goal status: INITIAL  PLAN:  PT FREQUENCY: 2x/week  PT DURATION: 6 weeks  PLANNED INTERVENTIONS: 97164- PT Re-evaluation, 97750- Physical Performance Testing, 97110-Therapeutic exercises, 97530- Therapeutic activity, [id number]- Neuromuscular re-education,  97535- Self Care, 02859- Manual therapy, [phone]- Gait training, [phone]- Electrical stimulation (unattended), 97035- Ultrasound, 79439 (1-2 muscles), 20561 (3+ muscles)- Dry Needling, Patient/Family education, Balance training, Stair training, Joint mobilization, Spinal mobilization, Cryotherapy, and Moist heat  PLAN FOR NEXT SESSION: Nustep, isometrics, lower trunk rotations, chin tucks, scapular retraction, manual therapy, and modalities as needed   Lacinda JAYSON Fass, PT 02/11/2024, 1:58 PM

## 2024-02-15 ENCOUNTER — Encounter

## 2024-02-19 ENCOUNTER — Ambulatory Visit: Admitting: Physical Therapy

## 2024-02-22 ENCOUNTER — Ambulatory Visit: Attending: Internal Medicine | Admitting: Physical Therapy

## 2024-02-22 DIAGNOSIS — M542 Cervicalgia: Secondary | ICD-10-CM | POA: Diagnosis present

## 2024-02-22 DIAGNOSIS — M5459 Other low back pain: Secondary | ICD-10-CM | POA: Insufficient documentation

## 2024-02-22 NOTE — Therapy (Signed)
 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   Celiac disease 08/17/2013   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   Depression    GERD (gastroesophageal reflux disease)    Gestational diabetes 12/15/2013   Headache    Hx of migraines    Hypertension    Hypothyroidism    IBS (irritable bowel syndrome)    Lupus    Vitamin D deficiency    Warm antibody hemolytic anemia (HCC)    Past Surgical History:  Procedure Laterality Date   NO PAST SURGERIES     Patient Active Problem List   Diagnosis Date Noted   GAD (generalized anxiety disorder) 08/13/2017   Asthma, chronic 02/12/2015   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  Rationale for Evaluation and Treatment: Rehabilitation  ONSET DATE: 2006  SUBJECTIVE:                                                                                                                                                                                                         SUBJECTIVE STATEMENT: Neck pain a 4 today.    PERTINENT HISTORY:  Asthma,  depression, Lupus, anxiety, hypertension, ankylosing spondylitis, ADD, fibromyalgia, PTSD (per patient report) and allergies  PAIN:  Are you having pain? Yes: NPRS scale: Current: 0/10 (low back) 2-3/10 (neck) Best: 0/10 (low back) 1/10 (neck) Worst: 10/10  Pain location: neck and lower spine Pain description: gnawing, aching, popping, and grinding  Aggravating factors: lifting, carrying, bending over (low back), looking down  Relieving factors: massage, TENS unit  PRECAUTIONS: Fall  RED FLAGS: None    WEIGHT BEARING RESTRICTIONS: No  FALLS:  Has patient fallen in last 6 months? Yes. Number of falls 2; my hip gives out and locks up due to pain   LIVING ENVIRONMENT: Lives with: lives with their family Lives in: House/apartment Stairs: Yes: External: 8-10 steps; can reach both; step to pattern Has following equipment at home: None  OCCUPATION: not working; she has filed for disability  PLOF: Independent with homemaking with ambulation  PATIENT GOALS: improved strength, reduced pain, be able to bowl  NEXT MD VISIT: 04/28/24  OBJECTIVE:  Note: Objective measures were completed at Evaluation unless otherwise noted.  PATIENT SURVEYS:  NDI:  NECK DISABILITY INDEX  Date: 02/06/24 Score  Pain intensity 1 = The pain is very mild at the moment  2. Personal care (washing, dressing, etc.) 2 = It is painful to look after myself and I am slow and careful  3. Lifting 2 = Pain prevents me lifting heavy weights off the floor, but I can manage if they are  conveniently placed, for example on a table  4. Reading 3 = I can't read as much as I want because of moderate pain in my neck  5. Headaches 4 = I have severe headaches, which come frequently   6. Concentration 3 = I have a lot of difficulty in concentrating when I want to  7. Work 5 =  I can't do any work at all  8. Driving 5 = I can't drive my car at all  9. Sleeping 5 =  My sleep is completely disturbed (5-7 hrs sleepless)    10. Recreation 4 =  I can hardly do any recreation activities because of pain in my neck  Total 34/50   Minimum Detectable Change (90% confidence): 5 points or 10% points  COGNITION: Overall cognitive status: Within functional limits for tasks assessed  SENSATION: Patient reports intermittent tingling in her neck and low back  POSTURE: rounded shoulders and forward head  PALPATION: TTP (R>L): bilateral upper trapezius, levator scapulae, cervical paraspinals, scapular stabilizers, and right latissimus dorsi    CERVICAL ROM:   Active ROM A/PROM (deg) eval  Flexion 22; increased pain  Extension 8  Right lateral flexion 10; increased pain  Left lateral flexion 28  Right rotation 41; most pain  Left rotation 38   (Blank rows = not tested)  LUMBAR ROM:   Active  A/PROM  eval  Flexion 18; painful   Extension 14  Right lateral flexion 50% limited; painful  Left lateral flexion 25% limited  Right rotation 50% limited; painful   Left rotation 50% limited   (Blank rows = not tested)   UPPER EXTREMITY ROM:  Active ROM Right eval Left eval  Shoulder flexion 123 107  Shoulder extension    Shoulder abduction 80; really aggravating 111  Shoulder adduction    Shoulder extension    Shoulder internal rotation To gluteals To PSIS  Shoulder external rotation To C7 To T1  Elbow flexion    Elbow extension    Wrist flexion    Wrist extension    Wrist ulnar deviation    Wrist radial deviation    Wrist pronation    Wrist supination     (Blank rows = not tested)  UPPER EXTREMITY MMT: unable to assess due to pain severity and irritability  TREATMENT DATE:     02/22/24:    Combo e'stim/US  at 1.50 W/CM2 x 12 minutes f/b STW/M  x 12 minutes to patient's bilateral cervical paraspinal and UT musculature f/b IFC at 80-150 Hz on 40% scan x 20 minutes.    Normal modality response following removal of modality.                                                                                                                                                       02/11/24 EXERCISE LOG  Exercise Repetitions and Resistance Comments  Nustep  L4 x 13 minutes   Manual therapy  See below    Scapular retraction  Limited due to pain   Scapular elevation/depression 20 reps  Added to HEP  Seated chin tuck   30 reps  Added to HEP        Blank cell = exercise not performed today  Manual Therapy Soft Tissue Mobilization: right upper trapezius, levator scapulae, cervical paraspinals, and suboccipitals, for reduced pain and tone   PATIENT EDUCATION:  Education details: healing Person educated: Patient Education method: Explanation Education comprehension: verbalized understanding  HOME EXERCISE PROGRAM:   ASSESSMENT:  CLINICAL IMPRESSION: Patient tolerated treatment very well today and felt better following.    OBJECTIVE IMPAIRMENTS: decreased activity tolerance, decreased balance, decreased mobility, difficulty walking, decreased ROM, decreased strength, hypomobility, impaired flexibility, impaired tone, impaired UE functional use, postural dysfunction, and pain.   ACTIVITY LIMITATIONS: carrying, lifting, bending, standing, stairs, reach over head, and locomotion level  PARTICIPATION LIMITATIONS: meal prep, cleaning, laundry, and community activity  PERSONAL FACTORS: Past/current experiences, Time since onset of injury/illness/exacerbation, and 3+ comorbidities: Asthma, depression, Lupus, anxiety, hypertension, ankylosing spondylitis, ADD, fibromyalgia, PTSD (per patient report) and allergies are also affecting patient's functional outcome.   REHAB POTENTIAL: Fair    CLINICAL DECISION MAKING: Evolving/moderate complexity  EVALUATION COMPLEXITY: Moderate   GOALS: Goals reviewed with patient? Yes  SHORT TERM GOALS: Target date: 02/27/24  Patient will be independent with her initial HEP.  Baseline:  Goal status: INITIAL  2.  Patient will be able to complete her daily  activities without her familiar pain exceeding 8/10.  Baseline:  Goal status: INITIAL  3.  Patient will improve her NDI score to 50% disability or less for improved perceived function with her daily activities. Baseline:  Goal status: INITIAL  4.  Patient will improve her cervical rotation to at least 60 degrees bilaterally for improved safety while driving. Baseline:  Goal status: INITIAL  LONG TERM GOALS: Target date: 03/19/24  Patient will be independent with her advanced HEP. Baseline:  Goal status: INITIAL  2.  Patient will be able to complete her daily activities without her familiar symptoms exceeding 6/10. Baseline:  Goal status: INITIAL  3.  Patient will improve her NDI score to 40% disability or less for improved perceived function with her daily activities. Baseline:  Goal status: INITIAL  4.  Patient will improve  her active right shoulder abduction to at least 115 degrees for improved function reaching overhead. Baseline:  Goal status: INITIAL  5.  Patient will report being able to go bowling without being limited by her familiar symptoms. Baseline:  Goal status: INITIAL  PLAN:  PT FREQUENCY: 2x/week  PT DURATION: 6 weeks  PLANNED INTERVENTIONS: 97164- PT Re-evaluation, 97750- Physical Performance Testing, 97110-Therapeutic exercises, 97530- Therapeutic activity, [id number]- Neuromuscular re-education, 97535- Self Care, [id number]- Manual therapy, [phone]- Gait training, [phone]- Electrical stimulation (unattended), 97035- Ultrasound, 79439 (1-2 muscles), 20561 (3+ muscles)- Dry Needling, Patient/Family education, Balance training, Stair training, Joint mobilization, Spinal mobilization, Cryotherapy, and Moist heat  PLAN FOR NEXT SESSION: Nustep, isometrics, lower trunk rotations, chin tucks, scapular retraction, manual therapy, and modalities as needed   Norm Wray, [place], PT 02/22/2024, 12:13 PM

## 2024-02-26 ENCOUNTER — Encounter: Payer: Self-pay | Admitting: *Deleted

## 2024-02-26 ENCOUNTER — Ambulatory Visit: Admitting: *Deleted

## 2024-02-26 DIAGNOSIS — M5459 Other low back pain: Secondary | ICD-10-CM

## 2024-02-26 DIAGNOSIS — M542 Cervicalgia: Secondary | ICD-10-CM

## 2024-02-26 NOTE — Therapy (Signed)
 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   Depression    GERD (gastroesophageal reflux disease)    Gestational diabetes 12/15/2013   Headache    Hx of migraines    Hypertension    Hypothyroidism    IBS (irritable bowel syndrome)    Lupus    Vitamin D deficiency    Warm antibody hemolytic anemia (HCC)    Past Surgical History:  Procedure Laterality Date   NO PAST SURGERIES     Patient Active Problem List   Diagnosis Date Noted   GAD (generalized anxiety disorder) 08/13/2017   Asthma, chronic 02/12/2015   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   Lupus (systemic lupus erythematosus) (HCC) 10/14/2013   Weight gain 08/15/2013   Depression, recurrent (HCC)    Vitamin D deficiency    Vitamin D deficiency 07/14/2013   Hypothyroid 07/14/2013   REFERRING PROVIDER: Ingrid Nurse, MD   REFERRING DIAG: Cervicalgia, Pain in right hip, Pain in left hip   THERAPY DIAG:  Cervicalgia  Other low back pain  Rationale for Evaluation and Treatment: Rehabilitation  ONSET DATE: 2006  SUBJECTIVE:                                                                                                                                                                                                         SUBJECTIVE STATEMENT: Neck pain a 6/10 today.  Did  a lot of overhead act.'s yesterday  PERTINENT HISTORY:  Asthma, depression, Lupus, anxiety, hypertension, ankylosing spondylitis,  ADD, fibromyalgia, PTSD (per patient report) and allergies  PAIN:  Are you having pain? Yes: NPRS scale: Current: 0/10 (low back) 2-3/10 (neck) Best: 0/10 (low back) 1/10 (neck) Worst: 10/10  Pain location: neck and lower spine Pain description: gnawing, aching, popping, and grinding  Aggravating factors: lifting, carrying, bending over (low back), looking down  Relieving factors: massage, TENS unit   PRECAUTIONS: Fall  RED FLAGS: None  WEIGHT BEARING RESTRICTIONS: No  FALLS:  Has patient fallen in last 6 months? Yes. Number of falls 2; my hip gives out and locks up due to pain   LIVING ENVIRONMENT: Lives with: lives with their family Lives in: House/apartment Stairs: Yes: External: 8-10 steps; can reach both; step to pattern Has following equipment at home: None  OCCUPATION: not working; she has filed for disability  PLOF: Independent with homemaking with ambulation  PATIENT GOALS: improved strength, reduced pain, be able to bowl  NEXT MD VISIT: 04/28/24  OBJECTIVE:  Note: Objective measures were completed at Evaluation unless otherwise noted.  PATIENT SURVEYS:  NDI:  NECK DISABILITY INDEX  Date: 02/06/24 Score  Pain intensity 1 = The pain is very mild at the moment  2. Personal care (washing, dressing, etc.) 2 = It is painful to look after myself and I am slow and careful  3. Lifting 2 = Pain prevents me lifting heavy weights off the floor, but I can manage if they are  conveniently placed, for example on a table  4. Reading 3 = I can't read as much as I want because of moderate pain in my neck  5. Headaches 4 = I have severe headaches, which come frequently   6. Concentration 3 = I have a lot of difficulty in concentrating when I want to  7. Work 5 =  I can't do any work at all  8. Driving 5 = I can't drive my car at all  9. Sleeping 5 =  My sleep is completely disturbed (5-7 hrs sleepless)   10. Recreation 4 =  I can hardly do any recreation activities  because of pain in my neck  Total 34/50   Minimum Detectable Change (90% confidence): 5 points or 10% points  COGNITION: Overall cognitive status: Within functional limits for tasks assessed  SENSATION: Patient reports intermittent tingling in her neck and low back  POSTURE: rounded shoulders and forward head  PALPATION: TTP (R>L): bilateral upper trapezius, levator scapulae, cervical paraspinals, scapular stabilizers, and right latissimus dorsi    CERVICAL ROM:   Active ROM A/PROM (deg) eval  Flexion 22; increased pain  Extension 8  Right lateral flexion 10; increased pain  Left lateral flexion 28  Right rotation 41; most pain  Left rotation 38   (Blank rows = not tested)  LUMBAR ROM:   Active  A/PROM  eval  Flexion 18; painful   Extension 14  Right lateral flexion 50% limited; painful  Left lateral flexion 25% limited  Right rotation 50% limited; painful   Left rotation 50% limited   (Blank rows = not tested)   UPPER EXTREMITY ROM:  Active ROM Right eval Left eval  Shoulder flexion 123 107  Shoulder extension    Shoulder abduction 80; really aggravating 111  Shoulder adduction    Shoulder extension    Shoulder internal rotation To gluteals To PSIS  Shoulder external rotation To C7 To T1  Elbow flexion    Elbow extension    Wrist flexion    Wrist extension    Wrist ulnar deviation    Wrist radial deviation    Wrist pronation    Wrist supination     (Blank rows = not tested)  UPPER EXTREMITY MMT: unable to assess due to pain severity and irritability  TREATMENT DATE:     02/26/24:     Combo e'stim/US  at 1.50 W/CM2 x 12 minutes 6 mins to each Utrap and cerv. Paras   STW/M  and TPR x 12 minutes to patient's bilateral cervical paraspinal and UT musculature   IFC at 80-150 Hz on 40% scan x 20 minutes to cerv. Paras and Utrap.    Normal modality response following removal of modality.                                                                                                                                                       02/11/24 EXERCISE LOG  Exercise Repetitions and Resistance Comments  Nustep  L4 x 13 minutes   Manual therapy  See below    Scapular retraction  Limited due to pain   Scapular elevation/depression 20 reps  Added to HEP  Seated chin tuck   30 reps  Added to HEP        Blank cell = exercise not performed today  Manual Therapy Soft Tissue Mobilization: right upper trapezius, levator scapulae, cervical paraspinals, and suboccipitals, for reduced pain and tone   PATIENT EDUCATION:  Education details: healing Person educated: Patient Education method: Explanation Education comprehension: verbalized understanding  HOME EXERCISE PROGRAM:   ASSESSMENT:  CLINICAL IMPRESSION: Patient arrived today not doing well  due to increased neck pain from doing a lot of overhead activities at home. Rx focused on decreasing pain and mm tension with US  combo, STW/TPR as well as IFC. Pt reports decreased pain end of session tolerated treatment very well today and felt better following.    OBJECTIVE IMPAIRMENTS: decreased activity tolerance, decreased balance, decreased mobility, difficulty walking, decreased ROM, decreased strength, hypomobility, impaired flexibility, impaired tone, impaired UE functional use, postural dysfunction, and pain.   ACTIVITY LIMITATIONS: carrying, lifting, bending, standing, stairs, reach over head, and locomotion level  PARTICIPATION LIMITATIONS: meal prep, cleaning, laundry, and community activity  PERSONAL FACTORS: Past/current experiences, Time since onset of injury/illness/exacerbation, and 3+ comorbidities: Asthma, depression, Lupus, anxiety, hypertension, ankylosing spondylitis, ADD, fibromyalgia, PTSD (per patient report) and allergies are also affecting patient's functional outcome.   REHAB POTENTIAL: Fair    CLINICAL DECISION MAKING: Evolving/moderate complexity  EVALUATION COMPLEXITY:  Moderate   GOALS: Goals reviewed with patient? Yes  SHORT TERM GOALS: Target date: 02/27/24  Patient will be independent with her initial HEP.  Baseline:  Goal status: INITIAL  2.  Patient will be able to complete her daily activities without her familiar pain exceeding 8/10.  Baseline:  Goal status: INITIAL  3.  Patient will improve her NDI score to 50% disability or less for improved perceived function with her daily activities. Baseline:  Goal status: INITIAL  4.  Patient will improve her cervical rotation to at least 60 degrees bilaterally for improved safety while driving. Baseline:  Goal status: INITIAL  LONG TERM GOALS: Target date: 03/19/24  Patient will be independent with her advanced HEP. Baseline:  Goal status: INITIAL  2.  Patient will  be able to complete her daily activities without her familiar symptoms exceeding 6/10. Baseline:  Goal status: INITIAL  3.  Patient will improve her NDI score to 40% disability or less for improved perceived function with her daily activities. Baseline:  Goal status: INITIAL  4.  Patient will improve her active right shoulder abduction to at least 115 degrees for improved function reaching overhead. Baseline:  Goal status: INITIAL  5.  Patient will report being able to go bowling without being limited by her familiar symptoms. Baseline:  Goal status: INITIAL  PLAN:  PT FREQUENCY: 2x/week  PT DURATION: 6 weeks  PLANNED INTERVENTIONS: 97164- PT Re-evaluation, 97750- Physical Performance Testing, 97110-Therapeutic exercises, 97530- Therapeutic activity, [id number]- Neuromuscular re-education, 97535- Self Care, 02859- Manual therapy, [phone]- Gait training, [phone]- Electrical stimulation (unattended), 97035- Ultrasound, 79439 (1-2 muscles), 20561 (3+ muscles)- Dry Needling, Patient/Family education, Balance training, Stair training, Joint mobilization, Spinal mobilization, Cryotherapy, and Moist heat  PLAN FOR NEXT SESSION: Nustep,  isometrics, chin tucks, scapular retraction, manual therapy, and modalities as needed   Shae Augello,[place], PTA 02/26/2024, 9:03 AM

## 2024-02-29 ENCOUNTER — Encounter: Admitting: *Deleted

## 2024-03-11 ENCOUNTER — Ambulatory Visit

## 2024-03-11 DIAGNOSIS — M542 Cervicalgia: Secondary | ICD-10-CM

## 2024-03-11 DIAGNOSIS — M5459 Other low back pain: Secondary | ICD-10-CM

## 2024-03-11 NOTE — Therapy (Signed)
 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   Celiac disease 08/17/2013   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   Depression    GERD (gastroesophageal reflux disease)    Gestational diabetes 12/15/2013   Headache    Hx of migraines    Hypertension    Hypothyroidism    IBS (irritable bowel syndrome)    Lupus    Vitamin D deficiency    Warm antibody hemolytic anemia (HCC)    Past Surgical History:  Procedure Laterality Date   NO PAST SURGERIES     Patient Active Problem List   Diagnosis Date Noted   GAD (generalized anxiety disorder) 08/13/2017   Asthma, chronic 02/12/2015   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   Lupus (systemic lupus erythematosus) (HCC) 10/14/2013   Weight gain 08/15/2013   Depression, recurrent (HCC)    Vitamin D deficiency    Vitamin D deficiency 07/14/2013   Hypothyroid 07/14/2013   REFERRING PROVIDER: Ingrid Nurse, MD   REFERRING DIAG: Cervicalgia, Pain in right hip, Pain in left hip   THERAPY DIAG:  Cervicalgia  Other low back pain  Rationale for Evaluation and Treatment: Rehabilitation  ONSET DATE: 2006  SUBJECTIVE:                                                                                                                                                                                                         SUBJECTIVE STATEMENT: Neck pain a 6/10 today.    PERTINENT HISTORY:  Asthma, depression, Lupus, anxiety, hypertension, ankylosing spondylitis, ADD, fibromyalgia, PTSD (per patient  report) and allergies  PAIN:  Are you having pain? Yes: NPRS scale: 6/10 Pain location: neck, lower spine, and bil hips Pain description: gnawing, aching, popping, and grinding  Aggravating factors: lifting, carrying, bending over (low back), looking down  Relieving factors: massage, TENS unit   PRECAUTIONS: Fall  RED FLAGS: None    WEIGHT BEARING RESTRICTIONS: No  FALLS:  Has patient fallen in last 6 months? Yes. Number of  falls 2; my hip gives out and locks up due to pain   LIVING ENVIRONMENT: Lives with: lives with their family Lives in: House/apartment Stairs: Yes: External: 8-10 steps; can reach both; step to pattern Has following equipment at home: None  OCCUPATION: not working; she has filed for disability  PLOF: Independent with homemaking with ambulation  PATIENT GOALS: improved strength, reduced pain, be able to bowl  NEXT MD VISIT: 04/28/24  OBJECTIVE:  Note: Objective measures were completed at Evaluation unless otherwise noted.  PATIENT SURVEYS:  NDI:  NECK DISABILITY INDEX  Date: 02/06/24 Score  Pain intensity 1 = The pain is very mild at the moment  2. Personal care (washing, dressing, etc.) 2 = It is painful to look after myself and I am slow and careful  3. Lifting 2 = Pain prevents me lifting heavy weights off the floor, but I can manage if they are  conveniently placed, for example on a table  4. Reading 3 = I can't read as much as I want because of moderate pain in my neck  5. Headaches 4 = I have severe headaches, which come frequently   6. Concentration 3 = I have a lot of difficulty in concentrating when I want to  7. Work 5 =  I can't do any work at all  8. Driving 5 = I can't drive my car at all  9. Sleeping 5 =  My sleep is completely disturbed (5-7 hrs sleepless)   10. Recreation 4 =  I can hardly do any recreation activities because of pain in my neck  Total 34/50   Minimum Detectable Change (90% confidence): 5 points or 10%  points  COGNITION: Overall cognitive status: Within functional limits for tasks assessed  SENSATION: Patient reports intermittent tingling in her neck and low back  POSTURE: rounded shoulders and forward head  PALPATION: TTP (R>L): bilateral upper trapezius, levator scapulae, cervical paraspinals, scapular stabilizers, and right latissimus dorsi    CERVICAL ROM:   Active ROM A/PROM (deg) eval  Flexion 22; increased pain  Extension 8  Right lateral flexion 10; increased pain  Left lateral flexion 28  Right rotation 41; most pain  Left rotation 38   (Blank rows = not tested)  LUMBAR ROM:   Active  A/PROM  eval  Flexion 18; painful   Extension 14  Right lateral flexion 50% limited; painful  Left lateral flexion 25% limited  Right rotation 50% limited; painful   Left rotation 50% limited   (Blank rows = not tested)   UPPER EXTREMITY ROM:  Active ROM Right eval Left eval  Shoulder flexion 123 107  Shoulder extension    Shoulder abduction 80; really aggravating 111  Shoulder adduction    Shoulder extension    Shoulder internal rotation To gluteals To PSIS  Shoulder external rotation To C7 To T1  Elbow flexion    Elbow extension    Wrist flexion    Wrist extension    Wrist ulnar deviation    Wrist radial deviation    Wrist pronation    Wrist supination     (Blank rows = not tested)  UPPER EXTREMITY MMT: unable to assess due to pain severity and irritability  TREATMENT DATE:     03/11/24:     Combo e'stim/US  at 1.50 W/CM2 x 14 minutes, 7 mins to each Utrap and cerv. Paras  STW/M and TPR to patient's bilateral cervical paraspinal and UT musculature   IFC at 80-150 Hz on 40%  scan x 20 minutes to cerv. Paras and Utrap.    Normal modality response following removal of modality.                                                                                                                                                      02/11/24 EXERCISE LOG  Exercise  Repetitions and Resistance Comments  Nustep  L4 x 13 minutes   Manual therapy  See below    Scapular retraction  Limited due to pain   Scapular elevation/depression 20 reps  Added to HEP  Seated chin tuck   30 reps  Added to HEP        Blank cell = exercise not performed today  Manual Therapy Soft Tissue Mobilization: right upper trapezius, levator scapulae, cervical paraspinals, and suboccipitals, for reduced pain and tone   PATIENT EDUCATION:  Education details: healing Person educated: Patient Education method: Explanation Education comprehension: verbalized understanding  HOME EXERCISE PROGRAM:   ASSESSMENT:  CLINICAL IMPRESSION: Pt arrives for today's treatment session reporting 6/10 neck pain, low back, and bil hip pain.  Pt reports continued pain and stiffness in neck and low back.  STW/M performed to bil upper traps to decrease pain and tone.  Normal responses to all modalities performed today.  Pt reported 3/10 neck pain at completion of today's treatment session.       OBJECTIVE IMPAIRMENTS: decreased activity tolerance, decreased balance, decreased mobility, difficulty walking, decreased ROM, decreased strength, hypomobility, impaired flexibility, impaired tone, impaired UE functional use, postural dysfunction, and pain.   ACTIVITY LIMITATIONS: carrying, lifting, bending, standing, stairs, reach over head, and locomotion level  PARTICIPATION LIMITATIONS: meal prep, cleaning, laundry, and community activity  PERSONAL FACTORS: Past/current experiences, Time since onset of injury/illness/exacerbation, and 3+ comorbidities: Asthma, depression, Lupus, anxiety, hypertension, ankylosing spondylitis, ADD, fibromyalgia, PTSD (per patient report) and allergies are also affecting patient's functional outcome.   REHAB POTENTIAL: Fair    CLINICAL DECISION MAKING: Evolving/moderate complexity  EVALUATION COMPLEXITY: Moderate   GOALS: Goals reviewed with patient? Yes  SHORT  TERM GOALS: Target date: 02/27/24  Patient will be independent with her initial HEP.  Baseline:  Goal status: INITIAL  2.  Patient will be able to complete her daily activities without her familiar pain exceeding 8/10.  Baseline:  Goal status: INITIAL  3.  Patient will improve her NDI score to 50% disability or less for improved perceived function with her daily activities. Baseline:  Goal status: INITIAL  4.  Patient will improve her cervical rotation to at least 60 degrees bilaterally for improved safety while driving. Baseline:  Goal status: INITIAL  LONG TERM GOALS: Target date: 03/19/24  Patient will be independent with her advanced HEP. Baseline:  Goal status: INITIAL  2.  Patient will be able to complete her daily activities without her familiar symptoms  exceeding 6/10. Baseline:  Goal status: INITIAL  3.  Patient will improve her NDI score to 40% disability or less for improved perceived function with her daily activities. Baseline:  Goal status: INITIAL  4.  Patient will improve her active right shoulder abduction to at least 115 degrees for improved function reaching overhead. Baseline:  Goal status: INITIAL  5.  Patient will report being able to go bowling without being limited by her familiar symptoms. Baseline:  Goal status: INITIAL  PLAN:  PT FREQUENCY: 2x/week  PT DURATION: 6 weeks  PLANNED INTERVENTIONS: 97164- PT Re-evaluation, 97750- Physical Performance Testing, 97110-Therapeutic exercises, 97530- Therapeutic activity, [id number]- Neuromuscular re-education, 97535- Self Care, [id number]- Manual therapy, [phone]- Gait training, [phone]- Electrical stimulation (unattended), 97035- Ultrasound, 79439 (1-2 muscles), [id number] (3+ muscles)- Dry Needling, Patient/Family education, Balance training, Stair training, Joint mobilization, Spinal mobilization, Cryotherapy, and Moist heat  PLAN FOR NEXT SESSION: Nustep, isometrics, chin tucks, scapular retraction, manual therapy, and  modalities as needed   Delon DELENA Gosling, PTA 03/11/2024, 3:32 PM

## 2024-03-14 ENCOUNTER — Ambulatory Visit: Admitting: *Deleted

## 2024-03-14 ENCOUNTER — Encounter: Payer: Self-pay | Admitting: *Deleted

## 2024-03-14 DIAGNOSIS — M5459 Other low back pain: Secondary | ICD-10-CM

## 2024-03-14 DIAGNOSIS — M542 Cervicalgia: Secondary | ICD-10-CM | POA: Diagnosis not present

## 2024-03-14 NOTE — Therapy (Signed)
 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   Depression    GERD (gastroesophageal reflux disease)    Gestational diabetes 12/15/2013   Headache    Hx of migraines    Hypertension    Hypothyroidism    IBS (irritable bowel syndrome)    Lupus    Vitamin D deficiency    Warm antibody hemolytic anemia (HCC)    Past Surgical History:  Procedure Laterality Date   NO PAST SURGERIES     Patient Active Problem List   Diagnosis Date Noted   GAD (generalized anxiety disorder) 08/13/2017   Asthma, chronic 02/12/2015   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   Lupus (systemic lupus erythematosus) (HCC) 10/14/2013   Weight gain 08/15/2013   Depression, recurrent (HCC)    Vitamin D deficiency    Vitamin D deficiency 07/14/2013   Hypothyroid 07/14/2013   REFERRING PROVIDER: Ingrid Nurse, MD   REFERRING DIAG: Cervicalgia, Pain in right hip, Pain in left hip   THERAPY DIAG:  Cervicalgia  Other low back pain  Rationale for Evaluation and Treatment: Rehabilitation  ONSET DATE: 2006  SUBJECTIVE:                                                                                                                                                                                                         SUBJECTIVE STATEMENT: Neck pain a 6-7/10 today.  RT side is worse.  PERTINENT HISTORY:  Asthma, depression, Lupus, anxiety, hypertension, ankylosing spondylitis, ADD, fibromyalgia,  PTSD (per patient report) and allergies  PAIN:  Are you having pain? Yes: NPRS scale: 6-7/10 Pain location: neck, lower spine, and bil hips Pain description: gnawing, aching, popping, and grinding  Aggravating factors: lifting, carrying, bending over (low back), looking down  Relieving factors: massage, TENS unit   PRECAUTIONS: Fall  RED FLAGS: None    WEIGHT BEARING RESTRICTIONS: No  FALLS:  Has patient fallen in last 6 months?  Yes. Number of falls 2; my hip gives out and locks up due to pain   LIVING ENVIRONMENT: Lives with: lives with their family Lives in: House/apartment Stairs: Yes: External: 8-10 steps; can reach both; step to pattern Has following equipment at home: None  OCCUPATION: not working; she has filed for disability  PLOF: Independent with homemaking with ambulation  PATIENT GOALS: improved strength, reduced pain, be able to bowl  NEXT MD VISIT: 04/28/24  OBJECTIVE:  Note: Objective measures were completed at Evaluation unless otherwise noted.  PATIENT SURVEYS:  NDI:  NECK DISABILITY INDEX  Date: 02/06/24 Score  Pain intensity 1 = The pain is very mild at the moment  2. Personal care (washing, dressing, etc.) 2 = It is painful to look after myself and I am slow and careful  3. Lifting 2 = Pain prevents me lifting heavy weights off the floor, but I can manage if they are  conveniently placed, for example on a table  4. Reading 3 = I can't read as much as I want because of moderate pain in my neck  5. Headaches 4 = I have severe headaches, which come frequently   6. Concentration 3 = I have a lot of difficulty in concentrating when I want to  7. Work 5 =  I can't do any work at all  8. Driving 5 = I can't drive my car at all  9. Sleeping 5 =  My sleep is completely disturbed (5-7 hrs sleepless)   10. Recreation 4 =  I can hardly do any recreation activities because of pain in my neck  Total 34/50   Minimum Detectable Change (90% confidence): 5  points or 10% points  COGNITION: Overall cognitive status: Within functional limits for tasks assessed  SENSATION: Patient reports intermittent tingling in her neck and low back  POSTURE: rounded shoulders and forward head  PALPATION: TTP (R>L): bilateral upper trapezius, levator scapulae, cervical paraspinals, scapular stabilizers, and right latissimus dorsi    CERVICAL ROM:   Active ROM A/PROM (deg) eval  Flexion 22; increased pain  Extension 8  Right lateral flexion 10; increased pain  Left lateral flexion 28  Right rotation 41; most pain  Left rotation 38   (Blank rows = not tested)  LUMBAR ROM:   Active  A/PROM  eval  Flexion 18; painful   Extension 14  Right lateral flexion 50% limited; painful  Left lateral flexion 25% limited  Right rotation 50% limited; painful   Left rotation 50% limited   (Blank rows = not tested)   UPPER EXTREMITY ROM:  Active ROM Right eval Left eval  Shoulder flexion 123 107  Shoulder extension    Shoulder abduction 80; really aggravating 111  Shoulder adduction    Shoulder extension    Shoulder internal rotation To gluteals To PSIS  Shoulder external rotation To C7 To T1  Elbow flexion    Elbow extension    Wrist flexion    Wrist extension    Wrist ulnar deviation    Wrist radial deviation    Wrist pronation    Wrist supination     (Blank rows = not tested)  UPPER EXTREMITY MMT: unable to assess due to pain severity and irritability  TREATMENT DATE:     03/14/24:     Combo e'stim/US  at 1.50 W/CM2 x 14 minutes 8 mins to the RT Utrap and  6 mins to LT  STW/M and TPR to patient's bilateral cervical paraspinals and UT musculature seated  IFC at 80-150 Hz on 40% scan x 20 minutes to cerv. Paras and Utrap.    Normal modality response following removal of modality.         Cervical rotation   LT  65 degrees   RT 60    degrees                                                                                                                                                 02/11/24 EXERCISE LOG  Exercise Repetitions and Resistance Comments  Nustep  L4 x 13 minutes   Manual therapy  See below    Scapular retraction  Limited due to pain   Scapular elevation/depression 20 reps  Added to HEP  Seated chin tuck   30 reps  Added to HEP        Blank cell = exercise not performed today  Manual Therapy Soft Tissue Mobilization: right upper trapezius, levator scapulae, cervical paraspinals, and suboccipitals, for reduced pain and tone   PATIENT EDUCATION:  Education details: healing Person educated: Patient Education method: Explanation Education comprehension: verbalized understanding  HOME EXERCISE PROGRAM:   ASSESSMENT:  CLINICAL IMPRESSION: Pt arrives for today's treatment session reporting 6-7/10 neck pain RT>LT.   Pt reports continued pain and stiffness in her neck and low back.  STW/M and TPR performed to bil upper traps to decrease pain and tone.  Normal responses to all modalities performed today.  Pt reported 4/10 neck pain at completion of today's treatment session.  Pt able to meet STG #  4   OBJECTIVE IMPAIRMENTS: decreased activity tolerance, decreased balance, decreased mobility, difficulty walking, decreased ROM, decreased strength, hypomobility, impaired flexibility, impaired tone, impaired UE functional use, postural dysfunction, and pain.   ACTIVITY LIMITATIONS: carrying, lifting, bending, standing, stairs, reach over head, and locomotion level  PARTICIPATION LIMITATIONS: meal prep, cleaning, laundry, and community activity  PERSONAL FACTORS: Past/current experiences, Time since onset of injury/illness/exacerbation, and 3+ comorbidities: Asthma, depression, Lupus, anxiety, hypertension, ankylosing spondylitis, ADD, fibromyalgia, PTSD (per patient report) and allergies are also affecting patient's functional outcome.   REHAB POTENTIAL: Fair    CLINICAL DECISION MAKING: Evolving/moderate  complexity  EVALUATION COMPLEXITY: Moderate   GOALS: Goals reviewed with patient? Yes  SHORT TERM GOALS: Target date: 02/27/24  Patient will be independent with her initial HEP.  Baseline:  Goal status: MET  2.  Patient will be able to complete her daily activities without her familiar pain exceeding 8/10.  Baseline:  Goal status: On going  3.  Patient will improve her NDI score to 50% disability or less for improved perceived function with her daily activities. Baseline:  Goal status: On going  4.  Patient will improve her cervical rotation to at least 60 degrees bilaterally for improved safety while driving. Baseline:  Goal status: MET  LONG TERM GOALS: Target date: 03/19/24  Patient will be independent with her advanced HEP. Baseline:  Goal status: INITIAL  2.  Patient will be able to complete her daily activities without her familiar symptoms exceeding 6/10. Baseline:  Goal status: INITIAL  3.  Patient will improve her NDI score to 40% disability or less for improved perceived function with her daily activities. Baseline:  Goal status: INITIAL  4.  Patient will improve her active right shoulder abduction to at least 115 degrees for improved function reaching overhead. Baseline:  Goal status: INITIAL  5.  Patient will report being able to go bowling without being limited by her familiar symptoms. Baseline:  Goal status: INITIAL  PLAN:  PT FREQUENCY: 2x/week  PT DURATION: 6 weeks  PLANNED INTERVENTIONS: 97164- PT Re-evaluation, 97750- Physical Performance Testing, 97110-Therapeutic exercises, 97530- Therapeutic activity, [id number]- Neuromuscular re-education, 97535- Self Care, 02859- Manual therapy, [phone]- Gait training, [phone]- Electrical stimulation (unattended), 97035- Ultrasound, 79439 (1-2 muscles), 20561 (3+ muscles)- Dry Needling, Patient/Family education, Balance training, Stair training, Joint mobilization, Spinal mobilization, Cryotherapy, and Moist  heat  PLAN FOR NEXT SESSION: Nustep, isometrics, chin tucks, scapular retraction, manual therapy, and modalities as needed   Tobie Perdue,CHRIS, PTA 03/14/2024, 12:05 PM

## 2024-03-21 ENCOUNTER — Encounter: Admitting: Physical Therapy
